# Patient Record
Sex: Female | Born: 1965 | Race: Black or African American | Hispanic: No | Marital: Single | Smoking: Never smoker
Health system: Southern US, Community
[De-identification: ages and names within clinical notes are randomized; demographics above are authoritative.]

## PROBLEM LIST (undated history)

## (undated) DIAGNOSIS — M109 Gout, unspecified: Secondary | ICD-10-CM

## (undated) DIAGNOSIS — I10 Essential (primary) hypertension: Secondary | ICD-10-CM

## (undated) DIAGNOSIS — E785 Hyperlipidemia, unspecified: Secondary | ICD-10-CM

## (undated) HISTORY — DX: Gout, unspecified: M10.9

## (undated) HISTORY — DX: Hyperlipidemia, unspecified: E78.5

---

## 2000-05-16 ENCOUNTER — Emergency Department (HOSPITAL_COMMUNITY): Admission: EM | Admit: 2000-05-16 | Discharge: 2000-05-16 | Payer: Self-pay | Admitting: Emergency Medicine

## 2004-06-09 ENCOUNTER — Inpatient Hospital Stay (HOSPITAL_COMMUNITY): Admission: AD | Admit: 2004-06-09 | Discharge: 2004-06-09 | Payer: Self-pay | Admitting: Family Medicine

## 2006-12-12 ENCOUNTER — Inpatient Hospital Stay (HOSPITAL_COMMUNITY): Admission: AD | Admit: 2006-12-12 | Discharge: 2006-12-13 | Payer: Self-pay | Admitting: Gynecology

## 2006-12-20 ENCOUNTER — Ambulatory Visit: Payer: Self-pay | Admitting: Family Medicine

## 2006-12-20 ENCOUNTER — Observation Stay (HOSPITAL_COMMUNITY): Admission: AD | Admit: 2006-12-20 | Discharge: 2006-12-21 | Payer: Self-pay | Admitting: Obstetrics and Gynecology

## 2007-12-10 ENCOUNTER — Emergency Department (HOSPITAL_COMMUNITY): Admission: EM | Admit: 2007-12-10 | Discharge: 2007-12-10 | Payer: Self-pay | Admitting: Family Medicine

## 2009-05-18 ENCOUNTER — Inpatient Hospital Stay (HOSPITAL_COMMUNITY): Admission: EM | Admit: 2009-05-18 | Discharge: 2009-05-20 | Payer: Self-pay | Admitting: Emergency Medicine

## 2010-07-26 ENCOUNTER — Emergency Department (HOSPITAL_COMMUNITY)
Admission: EM | Admit: 2010-07-26 | Discharge: 2010-07-26 | Payer: Self-pay | Source: Home / Self Care | Admitting: Family Medicine

## 2011-01-29 LAB — URINE DRUGS OF ABUSE SCREEN W ALC, ROUTINE (REF LAB)
Barbiturate Quant, Ur: NEGATIVE
Benzodiazepines.: NEGATIVE
Cocaine Metabolites: POSITIVE — AB
Marijuana Metabolite: POSITIVE — AB
Phencyclidine (PCP): NEGATIVE
Propoxyphene: NEGATIVE

## 2011-01-29 LAB — LIPID PANEL
LDL Cholesterol: 98 mg/dL (ref 0–99)
VLDL: 25 mg/dL (ref 0–40)

## 2011-01-29 LAB — TROPONIN I
Troponin I: 0.01 ng/mL (ref 0.00–0.06)
Troponin I: 0.01 ng/mL (ref 0.00–0.06)
Troponin I: 0.02 ng/mL (ref 0.00–0.06)

## 2011-01-29 LAB — CBC
HCT: 41 % (ref 36.0–46.0)
MCV: 95.1 fL (ref 78.0–100.0)
Platelets: 279 10*3/uL (ref 150–400)
RBC: 4.06 MIL/uL (ref 3.87–5.11)
RDW: 13.5 % (ref 11.5–15.5)
WBC: 6.4 10*3/uL (ref 4.0–10.5)
WBC: 6.4 10*3/uL (ref 4.0–10.5)

## 2011-01-29 LAB — DIFFERENTIAL
Eosinophils Relative: 1 % (ref 0–5)
Lymphocytes Relative: 27 % (ref 12–46)
Lymphs Abs: 1.7 10*3/uL (ref 0.7–4.0)
Neutro Abs: 4.2 10*3/uL (ref 1.7–7.7)

## 2011-01-29 LAB — BASIC METABOLIC PANEL
BUN: 8 mg/dL (ref 6–23)
BUN: 8 mg/dL (ref 6–23)
CO2: 25 mEq/L (ref 19–32)
Chloride: 109 mEq/L (ref 96–112)
Creatinine, Ser: 1.1 mg/dL (ref 0.4–1.2)
GFR calc Af Amer: >60 mL/min (ref >60)
GFR calc Af Amer: >60 mL/min (ref >60)
GFR calc non Af Amer: 57 mL/min — ABNORMAL LOW (ref >60)
Glucose, Bld: 95 mg/dL (ref 70–99)
Potassium: 3.8 mEq/L (ref 3.5–5.1)
Potassium: 3.9 mEq/L (ref 3.5–5.1)
Sodium: 137 mEq/L (ref 135–145)

## 2011-01-29 LAB — CK TOTAL AND CKMB (NOT AT ARMC)
CK, MB: 1.6 ng/mL (ref 0.3–4.0)
Relative Index: INVALID (ref 0.0–2.5)
Relative Index: INVALID (ref 0.0–2.5)
Total CK: 119 U/L (ref 7–177)
Total CK: 86 U/L (ref 7–177)
Total CK: 93 U/L (ref 7–177)

## 2011-01-29 LAB — THC (MARIJUANA), URINE, CONFIRMATION: Marijuana, Ur-Confirmation: 52 ng/mL

## 2011-01-29 LAB — TSH: TSH: 1.639 u[IU]/mL (ref 0.350–4.500)

## 2011-03-07 NOTE — H&P (Signed)
NAMESADHANA, Tammy Barajas NO.:  [id number]   MEDICAL RECORD NO.:  [id number]          PATIENT TYPE:  INP   LOCATION:  0107                         FACILITY:  [facility]   PHYSICIAN:  Della Goo, M.D. DATE OF BIRTH:  1966/10/22   DATE OF ADMISSION:  05/18/2009  DATE OF DISCHARGE:                              HISTORY & PHYSICAL   DATE OF ADMISSION:  May 18, 2009.   PRIMARY CARE PHYSICIAN:  Della Goo, M.D.   CHIEF COMPLAINT:  Elevated blood pressure.   HISTORY OF PRESENT ILLNESS:  This is a 45 year old female who was being  evaluated at [facility], who was sent to the emergency  department secondary to a severely elevated blood pressure.  The  patient's blood pressure at that time was reported as being 190/117.  The patient denies having any headache or any nausea, vomiting or  lightheadedness.  The patient states that she ran out of her medications  in April and on occasion, she has been taking her sister's medications.  The patient was seen and evaluated in the ER by the emergency department  physician and administered IV labetalol therapy with transient results.  The patient's blood pressure remained elevated, so the patient was  referred for admission.   PAST MEDICAL HISTORY:  Significant for hypertension, anemia,  hemorrhoids, and polysubstance abuse.   PAST SURGICAL HISTORY:  None.   MEDICATIONS:  The patient previously had been on hydrochlorothiazide 25  mg 1 p.o. daily, potassium chloride 20 mEq 1 p.o. daily and multivitamin  once daily.   ALLERGIES:  NO KNOWN DRUG ALLERGIES.   SOCIAL HISTORY:  The patient denies tobacco usage.  She reports  occasional alcohol usage, and she also does report occasional marijuana  usage.   FAMILY HISTORY:  Positive for hypertension and diabetes in her mother.  No history of coronary artery disease or cancer in her family that she  knows of.   REVIEW OF SYSTEMS:  Pertinents mentioned above.  All  other organ systems  are negative.   PHYSICAL EXAMINATION FINDINGS:  This is a 45 year old well-nourished,  well-developed female in no visible discomfort or acute distress.  Vital signs:  Temperature 97.5, blood pressure initially 210/119.  Heart  rate 68, respirations 18, O2 sats 99 to 100.  HEENT: Examination normocephalic, atraumatic.  Pupils equally round  reactive to light.  Extraocular movements are intact.  Funduscopic  benign.  There is no scleral icterus.  Nares are patent bilaterally.  Oropharynx is clear.  NECK:  Supple.  Full range of motion.  No thyromegaly, adenopathy, or  jugular venous distention.  CARDIOVASCULAR:  Regular rate and rhythm.  No murmurs, gallops or rubs.  LUNGS: Clear to auscultation bilaterally.  ABDOMEN: Positive bowel sounds, soft, nontender, nondistended.  No  hepatosplenomegaly.  EXTREMITIES: Without cyanosis, clubbing or edema.  NEUROLOGIC:  The patient is alert and oriented x3.  There are no focal  deficits on examination.   LABORATORY STUDIES:  White blood cell count 6.4, hemoglobin 13.7,  hematocrit 41.0, platelets 310, neutrophils 65% lymphocytes 27%.  Sodium  137, potassium 3.8, chloride 105.  Carbon  dioxide 25, BUN 8, creatinine  1.07, and glucose 95.   EKG reveals a normal sinus rhythm without acute ST-segment changes.   ASSESSMENT:  45 year old female being admitted with:  1. Hypertensive urgency  2. Medical noncompliance.  3. History of polysubstance abuse.   PLAN:  The patient will be admitted to the ICU area and placed on a  Cardene drip to help improve her blood pressure.  Norvasc therapy will  be started in the a.m. if her blood pressure tolerates.  A urine drug  screen has been ordered to evaluate for cocaine abuse, which may have  attributed to the patient's accelerated hypertension.  The patient will  be placed on DVT prophylaxis with SCDs for now.  IV Protonix therapy has  also been ordered.  If the patient's drug screen  returns positive for  cocaine, clonidine p.r.n. will also be ordered for possible withdrawal  symptoms.      Della Goo, M.D.  Electronically Signed     HJ/MEDQ  D:  05/18/2009  T:  05/18/2009  Job:  [id number]

## 2011-03-07 NOTE — Discharge Summary (Signed)
NAMEGLEN, Tammy Barajas NO.:  [id number]   MEDICAL RECORD NO.:  [id number]          PATIENT TYPE:  INP   LOCATION:  1428                         FACILITY:  [facility]   PHYSICIAN:  Hillery Aldo, M.D.   DATE OF BIRTH:  Mar 14, 1966   DATE OF ADMISSION:  05/18/2009  DATE OF DISCHARGE:  05/20/2009                               DISCHARGE SUMMARY   PRIMARY CARE PHYSICIAN:  Della Goo, M.D.   DISCHARGE DIAGNOSES:  1. Accelerated hypertension in the setting of cocaine abuse.  2. History of anemia.  3. Polysubstance abuse.   DISCHARGE MEDICATIONS:  1. Hydrochlorothiazide 25 mg p.o. daily.  2. Norvasc 5 mg p.o. daily.   CONSULTATIONS:  Social work.   BRIEF ADMISSION HISTORY OF PRESENT ILLNESS:  The patient is a 45-year-  old female who presented to the hospital with a chief complaint of  severely elevated blood pressure.  The patient presented to the [facility] and had routine vital signs done and was found to have a  blood pressure of 190/117 and subsequently was referred to the emergency  department for treatment of this.  The patient had been on  hydrochlorothiazide as an outpatient but states that she ran out of her  medications back in April and has not taken her blood pressure medicines  since that time.  For full details, please see the dictated report done by Dr. Lovell Sheehan.   PROCEDURES AND DIAGNOSTIC STUDIES:  None.   DISCHARGE LABORATORY VALUES:  Cardiac markers were negative x3 sets.  Lipid profile showed a cholesterol of 192, triglycerides 125, HDL 69,  LDL 98.  TSH was normal at 1.639.  Urine drug screen was positive for  marijuana and cocaine metabolites.   HOSPITAL COURSE BY PROBLEM:  1. Accelerated hypertension in the setting of cocaine abuse:  The      patient was admitted and labetalol failed to relieve her      hypertensive urgency.  She subsequently was admitted to the ICU and      put on a Cardene drip.  This was titrated off as  her blood pressure      came down.  She was resumed on hydrochlorothiazide and low-dose      Norvasc.  Her blood pressure was somewhat elevated, and the Norvasc      was titrated up prior to discharge.  The patient is encouraged to      comply with her medications as prescribed and to follow up with Dr.      Lovell Sheehan.  2. History of anemia:  The patient is not currently anemic.  3. Polysubstance abuse:  The patient was counseled regarding the risk      of heart attacks and stroke with accelerated hypertension in the      setting of cocaine abuse.  Social work was contacted for an ADS      referral.   DISPOSITION:  The patient is medically stable and will be discharged  home.  She is encouraged to follow up with Dr. Lovell Sheehan for ongoing  evaluation and treatment of her blood pressure issues.  She is  encouraged to avoid cocaine use.   Time spent coordinating care for discharge and discharge instructions  equals 25 minutes.      Hillery Aldo, M.D.  Electronically Signed     CR/MEDQ  D:  05/20/2009  T:  05/20/2009  Job:  [id number]   cc:   Della Goo, M.D.  Fax: [phone]

## 2013-07-23 ENCOUNTER — Encounter: Payer: Self-pay | Admitting: *Deleted

## 2013-09-03 ENCOUNTER — Encounter: Payer: Self-pay | Admitting: Obstetrics & Gynecology

## 2013-09-30 ENCOUNTER — Other Ambulatory Visit (HOSPITAL_COMMUNITY): Payer: Self-pay | Admitting: Internal Medicine

## 2013-09-30 DIAGNOSIS — Z1231 Encounter for screening mammogram for malignant neoplasm of breast: Secondary | ICD-10-CM

## 2013-10-21 ENCOUNTER — Ambulatory Visit (HOSPITAL_COMMUNITY): Payer: Self-pay

## 2013-11-10 ENCOUNTER — Ambulatory Visit (HOSPITAL_COMMUNITY)
Admission: RE | Admit: 2013-11-10 | Discharge: 2013-11-10 | Disposition: A | Discharge status: Home / Self Care | Payer: Self-pay | Source: Ambulatory Visit | Attending: Internal Medicine | Admitting: Internal Medicine

## 2013-11-10 DIAGNOSIS — Z1231 Encounter for screening mammogram for malignant neoplasm of breast: Secondary | ICD-10-CM

## 2013-11-12 ENCOUNTER — Other Ambulatory Visit: Payer: Self-pay | Admitting: Internal Medicine

## 2013-11-12 DIAGNOSIS — R928 Other abnormal and inconclusive findings on diagnostic imaging of breast: Secondary | ICD-10-CM

## 2013-11-13 ENCOUNTER — Other Ambulatory Visit: Payer: Self-pay | Admitting: Internal Medicine

## 2013-11-13 DIAGNOSIS — R928 Other abnormal and inconclusive findings on diagnostic imaging of breast: Secondary | ICD-10-CM

## 2013-12-02 ENCOUNTER — Ambulatory Visit (HOSPITAL_COMMUNITY): Payer: Self-pay

## 2013-12-17 ENCOUNTER — Other Ambulatory Visit: Payer: Self-pay

## 2013-12-22 ENCOUNTER — Other Ambulatory Visit: Payer: Self-pay

## 2014-01-06 ENCOUNTER — Encounter (INDEPENDENT_AMBULATORY_CARE_PROVIDER_SITE_OTHER): Payer: Self-pay

## 2014-01-06 ENCOUNTER — Encounter (HOSPITAL_COMMUNITY): Payer: Self-pay

## 2014-01-06 ENCOUNTER — Ambulatory Visit (HOSPITAL_COMMUNITY)
Admission: RE | Admit: 2014-01-06 | Discharge: 2014-01-06 | Disposition: A | Discharge status: Home / Self Care | Payer: Self-pay | Source: Ambulatory Visit | Attending: Obstetrics and Gynecology | Admitting: Obstetrics and Gynecology

## 2014-01-06 VITALS — BP 162/98 | Temp 98.9°F | Ht 66.5 in | Wt 230.6 lb

## 2014-01-06 DIAGNOSIS — Z1239 Encounter for other screening for malignant neoplasm of breast: Secondary | ICD-10-CM

## 2014-01-06 HISTORY — DX: Essential (primary) hypertension: I10

## 2014-01-06 NOTE — Patient Instructions (Signed)
Taught Tammy Barajas how to perform BSE and gave educational materials to take home. Patient did not need a Pap smear today due to last Pap smear was in November 2014 per patient. Let her know BCCCP will cover Pap smears every 3 years unless has a history of abnormal Pap smears. Referred patient to the [facility] for bilateral diagnostic mammogram and possible ultrasound per recommendation. Appointment scheduled for Thursday, January 15, 2014 at 1300. Patient aware of appointment and will be there. Tammy Barajas verbalized understanding.  Derrica Sieg, Arvil Chaco, RN 4:15 PM

## 2014-01-06 NOTE — Progress Notes (Signed)
Patient referred to [facility] by the [facility] due to recommending additional imaging of bilateral breasts. Screening mammogram completed 11/10/2013 at [facility].  Pap Smear:  Pap smear not completed today. Last Pap smear was November 2014 at the [facility] and normal per patient. Per patient no history of an abnormal Pap smear. No Pap smear results in EPIC.  Physical exam: Breasts Breasts symmetrical. No skin abnormalities bilateral breasts. No nipple retraction bilateral breasts. No nipple discharge bilateral breasts. No lymphadenopathy. No lumps palpated bilateral breasts. No complaints of pain or tenderness on exam. Referred patient to the [facility] for bilateral diagnostic mammogram and possible ultrasound per recommendation. Appointment scheduled for Thursday, January 15, 2014 at 1300    Pelvic/Bimanual No Pap smear completed today since last Pap smear was November 2014 per patient. Pap smear not indicated per BCCCP guidelines.

## 2014-01-15 ENCOUNTER — Ambulatory Visit
Admission: RE | Admit: 2014-01-15 | Discharge: 2014-01-15 | Disposition: A | Discharge status: Home / Self Care | Payer: No Typology Code available for payment source | Source: Ambulatory Visit | Attending: Internal Medicine | Admitting: Internal Medicine

## 2014-01-15 DIAGNOSIS — R928 Other abnormal and inconclusive findings on diagnostic imaging of breast: Secondary | ICD-10-CM

## 2014-01-16 ENCOUNTER — Ambulatory Visit: Payer: Self-pay

## 2014-01-16 ENCOUNTER — Other Ambulatory Visit: Payer: Self-pay

## 2014-05-14 ENCOUNTER — Encounter (HOSPITAL_COMMUNITY): Payer: Self-pay | Admitting: Emergency Medicine

## 2014-05-14 ENCOUNTER — Emergency Department (INDEPENDENT_AMBULATORY_CARE_PROVIDER_SITE_OTHER)
Admission: EM | Admit: 2014-05-14 | Discharge: 2014-05-14 | Disposition: A | Discharge status: Home / Self Care | Payer: Self-pay | Source: Home / Self Care

## 2014-05-14 DIAGNOSIS — I10 Essential (primary) hypertension: Secondary | ICD-10-CM

## 2014-05-14 MED ORDER — AMLODIPINE BESYLATE 5 MG PO TABS: ORAL_TABLET | ORAL | Status: DC

## 2014-05-14 MED ORDER — HYDROCHLOROTHIAZIDE 25 MG PO TABS: 25.0000 mg | ORAL_TABLET | Freq: Every day | ORAL | Status: DC

## 2014-05-14 NOTE — Discharge Instructions (Signed)
Hypertension °Hypertension, commonly called high blood pressure, is when the force of blood pumping through your arteries is too strong. Your arteries are the blood vessels that carry blood from your heart throughout your body. A blood pressure reading consists of a higher number over a lower number, such as 110/72. The higher number (systolic) is the pressure inside your arteries when your heart pumps. The lower number (diastolic) is the pressure inside your arteries when your heart relaxes. Ideally you want your blood pressure below 120/80. °Hypertension forces your heart to work harder to pump blood. Your arteries may become narrow or stiff. Having hypertension puts you at risk for heart disease, stroke, and other problems.  °RISK FACTORS °Some risk factors for high blood pressure are controllable. Others are not.  °Risk factors you cannot control include:  °· Race. You may be at higher risk if you are African American. °· Age. Risk increases with age. °· Gender. Men are at higher risk than women before age 45 years. After age 65, women are at higher risk than men. °Risk factors you can control include: °· Not getting enough exercise or physical activity. °· Being overweight. °· Getting too much fat, sugar, calories, or salt in your diet. °· Drinking too much alcohol. °SIGNS AND SYMPTOMS °Hypertension does not usually cause signs or symptoms. Extremely high blood pressure (hypertensive crisis) may cause headache, anxiety, shortness of breath, and nosebleed. °DIAGNOSIS  °To check if you have hypertension, your health care provider will measure your blood pressure while you are seated, with your arm held at the level of your heart. It should be measured at least twice using the same arm. Certain conditions can cause a difference in blood pressure between your right and left arms. A blood pressure reading that is higher than normal on one occasion does not mean that you need treatment. If one blood pressure reading  is high, ask your health care provider about having it checked again. °TREATMENT  °Treating high blood pressure includes making lifestyle changes and possibly taking medicine. Living a healthy lifestyle can help lower high blood pressure. You may need to change some of your habits. °Lifestyle changes may include: °· Following the DASH diet. This diet is high in fruits, vegetables, and whole grains. It is low in salt, red meat, and added sugars. °· Getting at least 2½ hours of brisk physical activity every week. °· Losing weight if necessary. °· Not smoking. °· Limiting alcoholic beverages. °· Learning ways to reduce stress. ° If lifestyle changes are not enough to get your blood pressure under control, your health care provider may prescribe medicine. You may need to take more than one. Work closely with your health care provider to understand the risks and benefits. °HOME CARE INSTRUCTIONS °· Have your blood pressure rechecked as directed by your health care provider.   °· Take medicines only as directed by your health care provider. Follow the directions carefully. Blood pressure medicines must be taken as prescribed. The medicine does not work as well when you skip doses. Skipping doses also puts you at risk for problems.   °· Do not smoke.   °· Monitor your blood pressure at home as directed by your health care provider.  °SEEK MEDICAL CARE IF:  °· You think you are having a reaction to medicines taken. °· You have recurrent headaches or feel dizzy. °· You have swelling in your ankles. °· You have trouble with your vision. °SEEK IMMEDIATE MEDICAL CARE IF: °· You develop a severe headache or confusion. °·   You have unusual weakness, numbness, or feel faint. °· You have severe chest or abdominal pain. °· You vomit repeatedly. °· You have trouble breathing. °MAKE SURE YOU:  °· Understand these instructions. °· Will watch your condition. °· Will get help right away if you are not doing well or get worse. °Document  Released: 10/09/2005 Document Revised: 02/23/2014 Document Reviewed: 08/01/2013 °ExitCare® Patient Information ©2015 [employer]. This information is not intended to replace advice given to you by your health care provider. Make sure you discuss any questions you have with your health care provider. ° °Managing Your High Blood Pressure °Blood pressure is a measurement of how forceful your blood is pressing against the walls of the arteries. Arteries are muscular tubes within the circulatory system. Blood pressure does not stay the same. Blood pressure rises when you are active, excited, or nervous; and it lowers during sleep and relaxation. If the numbers measuring your blood pressure stay above normal most of the time, you are at risk for health problems. High blood pressure (hypertension) is a long-term (chronic) condition in which blood pressure is elevated. °A blood pressure reading is recorded as two numbers, such as 120 over 80 (or 120/80). The first, higher number is called the systolic pressure. It is a measure of the pressure in your arteries as the heart beats. The second, lower number is called the diastolic pressure. It is a measure of the pressure in your arteries as the heart relaxes between beats.  °Keeping your blood pressure in a normal range is important to your overall health and prevention of health problems, such as heart disease and stroke. When your blood pressure is uncontrolled, your heart has to work harder than normal. High blood pressure is a very common condition in adults because blood pressure tends to rise with age. Men and women are equally likely to have hypertension but at different times in life. Before age 45, men are more likely to have hypertension. After 46 years of age, women are more likely to have it. Hypertension is especially common in African Americans. This condition often has no signs or symptoms. The cause of the condition is usually not known. Your caregiver can  help you come up with a plan to keep your blood pressure in a normal, healthy range. °BLOOD PRESSURE STAGES °Blood pressure is classified into four stages: normal, prehypertension, stage 1, and stage 2. Your blood pressure reading will be used to determine what type of treatment, if any, is necessary. Appropriate treatment options are tied to these four stages:  °Normal °· Systolic pressure (mm Hg): below 120. °· Diastolic pressure (mm Hg): below 80. °Prehypertension °· Systolic pressure (mm Hg): 120 to 139. °· Diastolic pressure (mm Hg): 80 to 89. °Stage 1 °· Systolic pressure (mm Hg): 140 to 159. °· Diastolic pressure (mm Hg): 90 to 99. °Stage 2 °· Systolic pressure (mm Hg): 160 or above. °· Diastolic pressure (mm Hg): 100 or above. °RISKS RELATED TO HIGH BLOOD PRESSURE °Managing your blood pressure is an important responsibility. Uncontrolled high blood pressure can lead to: °· A heart attack. °· A stroke. °· A weakened blood vessel (aneurysm). °· Heart failure. °· Kidney damage. °· Eye damage. °· Metabolic syndrome. °· Memory and concentration problems. °HOW TO MANAGE YOUR BLOOD PRESSURE °Blood pressure can be managed effectively with lifestyle changes and medicines (if needed). Your caregiver will help you come up with a plan to bring your blood pressure within a normal range. Your plan should include the following: °Education °·   Read all information provided by your caregivers about how to control blood pressure. °· Educate yourself on the latest guidelines and treatment recommendations. New research is always being done to further define the risks and treatments for high blood pressure. °Lifestyle changes °· Control your weight. °· Avoid smoking. °· Stay physically active. °· Reduce the amount of salt in your diet. °· Reduce stress. °· Control any chronic conditions, such as high cholesterol or diabetes. °· Reduce your alcohol intake. °Medicines °· Several medicines (antihypertensive medicines) are available,  if needed, to bring blood pressure within a normal range. °Communication °· Review all the medicines you take with your caregiver because there may be side effects or interactions. °· Talk with your caregiver about your diet, exercise habits, and other lifestyle factors that may be contributing to high blood pressure. °· See your caregiver regularly. Your caregiver can help you create and adjust your plan for managing high blood pressure. °RECOMMENDATIONS FOR TREATMENT AND FOLLOW-UP  °The following recommendations are based on current guidelines for managing high blood pressure in nonpregnant adults. Use these recommendations to identify the proper follow-up period or treatment option based on your blood pressure reading. You can discuss these options with your caregiver. °· Systolic pressure of 120 to 139 or diastolic pressure of 80 to 89: Follow up with your caregiver as directed. °· Systolic pressure of 140 to 160 or diastolic pressure of 90 to 100: Follow up with your caregiver within 2 months. °· Systolic pressure above 160 or diastolic pressure above 100: Follow up with your caregiver within 1 month. °· Systolic pressure above 180 or diastolic pressure above 110: Consider antihypertensive therapy; follow up with your caregiver within 1 week. °· Systolic pressure above 200 or diastolic pressure above 120: Begin antihypertensive therapy; follow up with your caregiver within 1 week. °Document Released: 07/03/2012 Document Reviewed: 07/03/2012 °ExitCare® Patient Information ©2015 [employer]. This information is not intended to replace advice given to you by your health care provider. Make sure you discuss any questions you have with your health care provider. ° °

## 2014-05-14 NOTE — ED Provider Notes (Signed)
CSN: [id number]     Arrival date & time 05/14/14  1515 History   First MD Initiated Contact with Patient 05/14/14 1536     Chief Complaint  Patient presents with  . Medication Refill   (Consider location/radiation/quality/duration/timing/severity/associated sxs/prior Treatment) HPI Comments: 48 year old obese female presents with a request to have her blood pressure medicines refilled. She states that her PCP cholesterol post suddenly and left town  2-3 months ago. She has no further complaints.   Past Medical History  Diagnosis Date  . Hypertension    History reviewed. No pertinent past surgical history. Family History  Problem Relation Age of Onset  . Hypertension Mother   . Hypertension Brother    History  Substance Use Topics  . Smoking status: Never Smoker   . Smokeless tobacco: Never Used  . Alcohol Use: Yes     Comment: occassionally   OB History   Grav Para Term Preterm Abortions TAB SAB Ect Mult Living   3    3  3    0     Review of Systems  Constitutional: Negative.   HENT: Negative.   Respiratory: Negative.  Negative for shortness of breath.   Cardiovascular: Negative for chest pain, palpitations and leg swelling.  Gastrointestinal: Negative.   Musculoskeletal: Negative for joint swelling.  Skin: Negative for pallor and rash.  Neurological: Negative.     Allergies  Review of patient's allergies indicates no known allergies.  Home Medications   Prior to Admission medications   Medication Sig Start Date End Date Taking? Authorizing Provider  amLODipine (NORVASC) 5 MG tablet One tablet q d for BP 05/14/14   Janne Napoleon, NP  hydrochlorothiazide (HYDRODIURIL) 25 MG tablet Take 1 tablet (25 mg total) by mouth daily. 05/14/14   Janne Napoleon, NP  Multiple Vitamin (MULTIVITAMIN) tablet Take 1 tablet by mouth daily.    Historical Provider, MD   BP 186/114  Pulse 93  Temp(Src) 97.8 F (36.6 C) (Oral)  Resp 16  SpO2 100%  LMP 04/25/2014 Physical Exam  Nursing  note and vitals reviewed. Constitutional: She appears well-developed and well-nourished. No distress.  Eyes: Conjunctivae and EOM are normal.  Neck: Normal range of motion. Neck supple.  Cardiovascular: Normal rate, regular rhythm, normal heart sounds and intact distal pulses.   Pulmonary/Chest: Effort normal and breath sounds normal. No respiratory distress. She has no wheezes. She has no rales.  Musculoskeletal: She exhibits no edema.  Neurological: She is alert. She exhibits normal muscle tone.  Skin: Skin is warm and dry.  Psychiatric: She has a normal mood and affect.    ED Course  Procedures (including critical care time) Labs Review Labs Reviewed - No data to display  Imaging Review No results found.   MDM   1. Essential hypertension    RF HCTZ 25 q d amlodipine5 mg q d F/U with doctor scheduled for next month    Janne Napoleon, NP 05/14/14 1556

## 2014-05-14 NOTE — ED Provider Notes (Signed)
Medical screening examination/treatment/procedure(s) were performed by a resident physician or non-physician practitioner and as the supervising physician I was immediately available for consultation/collaboration.  Linna Darner, MD Family Medicine   Waldemar Dickens, MD 05/14/14 [phone]

## 2014-05-14 NOTE — ED Notes (Signed)
Pt     Ran  Out      Of   Her  bp          bp  meds  About  10  Days              As  Her  Doctor      Has  Left      -  She  Says  She  Has     An  appt  19  Aug         With         [facility]

## 2014-06-10 ENCOUNTER — Encounter (HOSPITAL_COMMUNITY): Payer: Self-pay | Admitting: Emergency Medicine

## 2014-06-10 ENCOUNTER — Emergency Department (INDEPENDENT_AMBULATORY_CARE_PROVIDER_SITE_OTHER)
Admission: EM | Admit: 2014-06-10 | Discharge: 2014-06-10 | Disposition: A | Discharge status: Home / Self Care | Payer: Self-pay | Source: Home / Self Care

## 2014-06-10 ENCOUNTER — Ambulatory Visit: Payer: Self-pay | Attending: Internal Medicine

## 2014-06-10 DIAGNOSIS — I10 Essential (primary) hypertension: Secondary | ICD-10-CM

## 2014-06-10 MED ORDER — HYDROCHLOROTHIAZIDE 25 MG PO TABS: 25.0000 mg | ORAL_TABLET | Freq: Every day | ORAL | Status: DC

## 2014-06-10 MED ORDER — AMLODIPINE BESYLATE 5 MG PO TABS: 5.0000 mg | ORAL_TABLET | Freq: Every day | ORAL | Status: DC

## 2014-06-10 NOTE — ED Provider Notes (Signed)
Medical screening examination/treatment/procedure(s) were performed by non-physician practitioner and as supervising physician I was immediately available for consultation/collaboration.  Philipp Deputy, M.D.  Harden Mo, MD 06/10/14 2230

## 2014-06-10 NOTE — ED Notes (Signed)
Pt  Is  Out  Of  Her norvasc     5  Mg  And  hctz   25  Mg      She  Was  Told  By the  [facility] to come  Here  For  Refills      She  Verbalizes  No  Complaints

## 2014-06-10 NOTE — ED Provider Notes (Signed)
CSN: [id number]     Arrival date & time 06/10/14  1229 History   First MD Initiated Contact with Patient 06/10/14 1258     Chief Complaint  Patient presents with  . Medication Refill   (Consider location/radiation/quality/duration/timing/severity/associated sxs/prior Treatment) HPI Comments: 48 year old female with a history of hypertension returns again this month for refill on her blood pressure medicines. The last visit she told me she was scheduled to have an appointment with a physician this month to get her blood pressure meds rewritten. It turns out that she was getting financial help this month and does not have an appointment with a physician. She states her blood pressure is elevated because she went out and got drunk last night in a some salty fish and has not taken her blood pressure medication this morning.   Past Medical History  Diagnosis Date  . Hypertension    History reviewed. No pertinent past surgical history. Family History  Problem Relation Age of Onset  . Hypertension Mother   . Hypertension Brother    History  Substance Use Topics  . Smoking status: Never Smoker   . Smokeless tobacco: Never Used  . Alcohol Use: Yes     Comment: occassionally   OB History   Grav Para Term Preterm Abortions TAB SAB Ect Mult Living   3    3  3    0     Review of Systems  Constitutional: Negative.   Respiratory: Negative.   Cardiovascular: Negative.  Negative for chest pain and palpitations.  Gastrointestinal: Negative.   Neurological: Negative.     Allergies  Review of patient's allergies indicates no known allergies.  Home Medications   Prior to Admission medications   Medication Sig Start Date End Date Taking? Authorizing Provider  amLODipine (NORVASC) 5 MG tablet One tablet q d for BP 05/14/14   Janne Napoleon, NP  amLODipine (NORVASC) 5 MG tablet Take 1 tablet (5 mg total) by mouth daily. 06/10/14   Janne Napoleon, NP  hydrochlorothiazide (HYDRODIURIL) 25 MG tablet Take  1 tablet (25 mg total) by mouth daily. 05/14/14   Janne Napoleon, NP  hydrochlorothiazide (HYDRODIURIL) 25 MG tablet Take 1 tablet (25 mg total) by mouth daily. 06/10/14   Janne Napoleon, NP  Multiple Vitamin (MULTIVITAMIN) tablet Take 1 tablet by mouth daily.    Historical Provider, MD   BP 177/102  Pulse 72  Temp(Src) 98.6 F (37 C) (Oral)  Resp 18  SpO2 100%  LMP 04/25/2014 Physical Exam  Nursing note and vitals reviewed. Constitutional: She is oriented to person, place, and time. She appears well-developed and well-nourished. No distress.  Eyes: Conjunctivae and EOM are normal.  Neck: Normal range of motion. Neck supple.  Cardiovascular: Normal rate, regular rhythm and normal heart sounds.   Pulmonary/Chest: Effort normal and breath sounds normal. No respiratory distress.  Musculoskeletal: She exhibits no edema.  Neurological: She is alert and oriented to person, place, and time.  Skin: Skin is warm and dry.  Psychiatric: She has a normal mood and affect.    ED Course  Procedures (including critical care time) Labs Review Labs Reviewed - No data to display  Imaging Review No results found.   MDM   1. Essential hypertension     Refill amlodipine and HCTZ. Must see PCP asap.    Janne Napoleon, NP 06/10/14 1357

## 2014-06-10 NOTE — Discharge Instructions (Signed)

## 2014-07-03 ENCOUNTER — Encounter: Payer: Self-pay | Admitting: Internal Medicine

## 2014-07-03 ENCOUNTER — Ambulatory Visit: Payer: Self-pay | Attending: Internal Medicine | Admitting: Internal Medicine

## 2014-07-03 VITALS — BP 138/95 | HR 86 | Temp 98.0°F | Resp 16 | Ht 66.5 in | Wt 226.0 lb

## 2014-07-03 DIAGNOSIS — I10 Essential (primary) hypertension: Secondary | ICD-10-CM | POA: Insufficient documentation

## 2014-07-03 DIAGNOSIS — Z2821 Immunization not carried out because of patient refusal: Secondary | ICD-10-CM

## 2014-07-03 LAB — CBC
HCT: 37.2 % (ref 36.0–46.0)
HEMOGLOBIN: 12.2 g/dL (ref 12.0–15.0)
MCH: 29.3 pg (ref 26.0–34.0)
MCHC: 32.8 g/dL (ref 30.0–36.0)
MCV: 89.4 fL (ref 78.0–100.0)
PLATELETS: 340 10*3/uL (ref 150–400)
RBC: 4.16 MIL/uL (ref 3.87–5.11)
RDW: 14.4 % (ref 11.5–15.5)
WBC: 6.4 10*3/uL (ref 4.0–10.5)

## 2014-07-03 MED ORDER — HYDROCHLOROTHIAZIDE 25 MG PO TABS: 25.0000 mg | ORAL_TABLET | Freq: Every day | ORAL | Status: DC

## 2014-07-03 MED ORDER — AMLODIPINE BESYLATE 5 MG PO TABS: 5.0000 mg | ORAL_TABLET | Freq: Every day | ORAL | Status: DC

## 2014-07-03 NOTE — Progress Notes (Signed)
Patient ID: [name], female   DOB: 08/19/1966, 48 y.o.   MRN: [id number]     [id number]  [id number]  DOB - 03-04-1966  CC:  Chief Complaint  Patient presents with  . Establish Care       HPI: Tammy Barajas is a 48 y.o. female here today to establish medical care.  Patient presents to clinic today to establish care.  She reports that she had a PCP in [facility] but the practice closed.  Patient reports that she went to urgent care to get a refill until she found a PCP.  She is fairly healthy and has no other medical history other than hypertension.  She denies tobacco use but endorses marijuana use. She is currently up to date on pap and mammography.    Patient has No headache, No chest pain, No abdominal pain - No Nausea, No new weakness tingling or numbness, No Cough - SOB.  No Known Allergies Past Medical History  Diagnosis Date  . Hypertension    Current Outpatient Prescriptions on File Prior to Visit  Medication Sig Dispense Refill  . amLODipine (NORVASC) 5 MG tablet One tablet q d for BP  30 tablet  0  . amLODipine (NORVASC) 5 MG tablet Take 1 tablet (5 mg total) by mouth daily.  30 tablet  0  . hydrochlorothiazide (HYDRODIURIL) 25 MG tablet Take 1 tablet (25 mg total) by mouth daily.  30 tablet  0  . hydrochlorothiazide (HYDRODIURIL) 25 MG tablet Take 1 tablet (25 mg total) by mouth daily.  30 tablet  0  . Multiple Vitamin (MULTIVITAMIN) tablet Take 1 tablet by mouth daily.       No current facility-administered medications on file prior to visit.   Family History  Problem Relation Age of Onset  . Hypertension Mother   . Hypertension Brother    History   Social History  . Marital Status: Single    Spouse Name: N/A    Number of Children: N/A  . Years of Education: N/A   Occupational History  . Not on file.   Social History Main Topics  . Smoking status: Never Smoker   . Smokeless tobacco: Never Used  . Alcohol Use: Yes     Comment:  occassionally  . Drug Use: 3.00 per week    Special: Marijuana  . Sexual Activity: Yes    Birth Control/ Protection: None, Condom   Other Topics Concern  . Not on file   Social History Narrative  . No narrative on file    Review of Systems: Constitutional: Negative for fever, chills, diaphoresis, activity change, appetite change and fatigue. HENT: Negative for ear pain, nosebleeds, congestion, facial swelling, rhinorrhea, neck pain, neck stiffness and ear discharge.  Eyes: Negative for pain, discharge, redness, itching and visual disturbance. Respiratory: Negative for cough, choking, chest tightness, shortness of breath, wheezing and stridor.  Cardiovascular: Negative for chest pain, palpitations and leg swelling. Gastrointestinal: Negative for abdominal distention. Genitourinary: Negative for dysuria, urgency, frequency, hematuria, flank pain, decreased urine volume, difficulty urinating and dyspareunia.  Musculoskeletal: Negative for back pain, joint swelling, arthralgia and gait problem. Neurological: Negative for dizziness, tremors, seizures, syncope, facial asymmetry, speech difficulty, weakness, light-headedness, numbness and headaches.  Hematological: Negative for adenopathy. Does not bruise/bleed easily. Psychiatric/Behavioral: Negative for hallucinations, behavioral problems, confusion, dysphoric mood, decreased concentration and agitation.    Objective:   Filed Vitals:   07/03/14 1148  BP: 138/95  Pulse: 86  Temp: 98 F (36.7 C)  Resp: 16    Physical Exam: Constitutional: Patient appears well-developed and well-nourished. No distress. HENT: Normocephalic, atraumatic, External right and left ear normal. Oropharynx is clear and moist.  Eyes: Conjunctivae and EOM are normal. PERRLA, no scleral icterus. Neck: Normal ROM. Neck supple. No JVD. No tracheal deviation. No thyromegaly. CVS: RRR, S1/S2 +, no murmurs, no gallops, no carotid bruit.  Pulmonary: Effort and  breath sounds normal, no stridor, rhonchi, wheezes, rales.  Abdominal: Soft. BS +, no distension, tenderness, rebound or guarding.  Musculoskeletal: Normal range of motion. No edema and no tenderness.  Lymphadenopathy: No lymphadenopathy noted, cervical,  Neuro: Alert. Normal reflexes, muscle tone coordination. No cranial nerve deficit. Skin: Skin is warm and dry. No rash noted. Not diaphoretic. No erythema. No pallor. Psychiatric: Normal mood and affect. Behavior, judgment, thought content normal.  Lab Results  Component Value Date   WBC 6.4 05/19/2009   HGB 12.5 05/19/2009   HCT 38.8 05/19/2009   MCV 95.4 05/19/2009   PLT 279 05/19/2009   Lab Results  Component Value Date   CREATININE 1.10 05/19/2009   BUN 8 05/19/2009   NA 140 05/19/2009   K 3.9 05/19/2009   CL 109 05/19/2009   CO2 25 05/19/2009    No results found for this basename: HGBA1C   Lipid Panel     Component Value Date/Time   CHOL  Value: 192        ATP III CLASSIFICATION:  <200     mg/dL   Desirable  200-239  mg/dL   Borderline High  >=240    mg/dL   High        05/19/2009 0610   TRIG 125 05/19/2009 0610   HDL 69 05/19/2009 0610   CHOLHDL 2.8 05/19/2009 0610   VLDL 25 05/19/2009 0610   LDLCALC  Value: 98        Total Cholesterol/HDL:CHD Risk Coronary Heart Disease Risk Table                     Men   Women  1/2 Average Risk   3.4   3.3  Average Risk       5.0   4.4  2 X Average Risk   9.6   7.1  3 X Average Risk  23.4   11.0        Use the calculated Patient Ratio above and the CHD Risk Table to determine the patient's CHD Risk.        ATP III CLASSIFICATION (LDL):  <100     mg/dL   Optimal  100-129  mg/dL   Near or Above                    Optimal  130-159  mg/dL   Borderline  160-189  mg/dL   High  >190     mg/dL   Very High 05/19/2009 0610       Assessment and plan:   Tammy Barajas was seen today for establish care.  Diagnoses and associated orders for this visit:  Essential hypertension - Continue hydrochlorothiazide  (HYDRODIURIL) 25 MG tablet; Take 1 tablet (25 mg total) by mouth daily. - Continue amLODipine (NORVASC) 5 MG tablet; Take 1 tablet (5 mg total) by mouth daily. - CBC - COMPLETE METABOLIC PANEL WITH GFR - Lipid panel - Hemoglobin A1C - TSH  Influenza vaccine refused Stressed importance of immunization    Return in about 6 months (around 01/01/2015) for Hypertension.  The patient was  given clear instructions to go to ER or return to medical center if symptoms don't improve, worsen or new problems develop. The patient verbalized understanding.    Chari Manning, NP-C [facility] [phone] 07/20/2014, 2:16 PM

## 2014-07-03 NOTE — Progress Notes (Signed)
Pt is here to establish care. Pt has a history of HTN.

## 2014-07-04 LAB — COMPLETE METABOLIC PANEL WITH GFR
ALBUMIN: 4.2 g/dL (ref 3.5–5.2)
ALT: 14 U/L (ref 0–35)
AST: 17 U/L (ref 0–37)
Alkaline Phosphatase: 54 U/L (ref 39–117)
BILIRUBIN TOTAL: 0.5 mg/dL (ref 0.2–1.2)
BUN: 15 mg/dL (ref 6–23)
CO2: 32 mEq/L (ref 19–32)
Calcium: 9.5 mg/dL (ref 8.4–10.5)
Chloride: 99 mEq/L (ref 96–112)
Creat: 0.97 mg/dL (ref 0.50–1.10)
GFR, Est African American: 82 mL/min
GFR, Est Non African American: 71 mL/min
Glucose, Bld: 93 mg/dL (ref 70–99)
Potassium: 4.2 mEq/L (ref 3.5–5.3)
SODIUM: 137 meq/L (ref 135–145)
Total Protein: 7.7 g/dL (ref 6.0–8.3)

## 2014-07-04 LAB — LIPID PANEL
CHOL/HDL RATIO: 2.7 ratio
Cholesterol: 192 mg/dL (ref 0–200)
HDL: 71 mg/dL (ref >39)
LDL CALC: 98 mg/dL (ref 0–99)
Triglycerides: 113 mg/dL (ref <150)
VLDL: 23 mg/dL (ref 0–40)

## 2014-07-04 LAB — TSH: TSH: 0.756 u[IU]/mL (ref 0.350–4.500)

## 2014-07-04 LAB — HEMOGLOBIN A1C
Hgb A1c MFr Bld: 5.7 % — ABNORMAL HIGH (ref <5.7)
MEAN PLASMA GLUCOSE: 117 mg/dL — AB (ref <117)

## 2014-07-17 ENCOUNTER — Telehealth: Payer: Self-pay | Admitting: Emergency Medicine

## 2014-07-17 ENCOUNTER — Encounter: Payer: Self-pay | Admitting: Emergency Medicine

## 2014-07-17 NOTE — Telephone Encounter (Signed)
Pt given lab results 

## 2014-07-17 NOTE — Telephone Encounter (Signed)
Message copied by Ricci Barker on Fri Jul 17, 2014  5:34 PM ------      Message from: Chari Manning A      Created: Mon Jul 13, 2014 10:44 PM       Labs are normal ------

## 2014-08-24 ENCOUNTER — Encounter: Payer: Self-pay | Admitting: Internal Medicine

## 2015-04-30 ENCOUNTER — Other Ambulatory Visit: Payer: Self-pay | Admitting: Internal Medicine

## 2015-05-05 ENCOUNTER — Telehealth: Payer: Self-pay | Admitting: Internal Medicine

## 2015-05-05 NOTE — Telephone Encounter (Signed)
Patient called requesting medication refill for hydrochlorothiazide (HYDRODIURIL) 25 MG tablet and amLODipine (NORVASC) 5 MG tablet. Please f/u

## 2015-05-10 ENCOUNTER — Ambulatory Visit: Payer: Self-pay | Attending: Internal Medicine | Admitting: Internal Medicine

## 2015-05-10 ENCOUNTER — Encounter: Payer: Self-pay | Admitting: Internal Medicine

## 2015-05-10 VITALS — BP 172/108 | HR 82 | Temp 98.0°F | Resp 16 | Ht 66.0 in | Wt 228.0 lb

## 2015-05-10 DIAGNOSIS — Z79899 Other long term (current) drug therapy: Secondary | ICD-10-CM | POA: Insufficient documentation

## 2015-05-10 DIAGNOSIS — F17210 Nicotine dependence, cigarettes, uncomplicated: Secondary | ICD-10-CM | POA: Insufficient documentation

## 2015-05-10 DIAGNOSIS — R030 Elevated blood-pressure reading, without diagnosis of hypertension: Secondary | ICD-10-CM

## 2015-05-10 DIAGNOSIS — I10 Essential (primary) hypertension: Secondary | ICD-10-CM | POA: Insufficient documentation

## 2015-05-10 DIAGNOSIS — IMO0001 Reserved for inherently not codable concepts without codable children: Secondary | ICD-10-CM

## 2015-05-10 LAB — BASIC METABOLIC PANEL
BUN: 14 mg/dL (ref 6–23)
CO2: 23 meq/L (ref 19–32)
Calcium: 9.1 mg/dL (ref 8.4–10.5)
Chloride: 105 mEq/L (ref 96–112)
Creat: 1.07 mg/dL (ref 0.50–1.10)
Glucose, Bld: 92 mg/dL (ref 70–99)
POTASSIUM: 4.3 meq/L (ref 3.5–5.3)
Sodium: 139 mEq/L (ref 135–145)

## 2015-05-10 MED ORDER — LOSARTAN POTASSIUM 25 MG PO TABS: 25.0000 mg | ORAL_TABLET | Freq: Every day | ORAL | Status: DC

## 2015-05-10 MED ORDER — HYDROCHLOROTHIAZIDE 25 MG PO TABS: 25.0000 mg | ORAL_TABLET | Freq: Every day | ORAL | Status: DC

## 2015-05-10 MED ORDER — CLONIDINE HCL 0.1 MG PO TABS
0.2000 mg | ORAL_TABLET | Freq: Once | ORAL | Status: AC
  Administered 2015-05-10: 0.2 mg via ORAL

## 2015-05-10 NOTE — Progress Notes (Signed)
Patient here for follow up for HTN. Patient denies any pain today. Patient stopped taking Norvasc about 2-3 weeks ago due to it making her have headaches. Patient needs a refill on hydrochlorothiazide. Patient took her last tablet on Saturday, 05/08/15. Patient has not taken any medications today.

## 2015-05-10 NOTE — Progress Notes (Signed)
Patient ID: Tammy Barajas, female   DOB: 04/29/1966, 49 y.o.   MRN: [id number] Subjective:  Tammy Barajas is a 49 y.o. female with hypertension. Patient reports that she has not taken Norvasc in over 3 weeks and has been out of HCTZ for 1 week.  Current Outpatient Prescriptions  Medication Sig Dispense Refill  . hydrochlorothiazide (HYDRODIURIL) 25 MG tablet Take 1 tablet (25 mg total) by mouth daily. 30 tablet 0  . hydrochlorothiazide (HYDRODIURIL) 25 MG tablet Take 1 tablet (25 mg total) by mouth daily. 30 tablet 5  . Multiple Vitamin (MULTIVITAMIN) tablet Take 1 tablet by mouth daily.    Marland Kitchen amLODipine (NORVASC) 5 MG tablet One tablet q d for BP (Patient not taking: Reported on 05/10/2015) 30 tablet 0  . amLODipine (NORVASC) 5 MG tablet Take 1 tablet (5 mg total) by mouth daily. (Patient not taking: Reported on 05/10/2015) 30 tablet 5   No current facility-administered medications for this visit.    Hypertension ROS: not taking medications regularly as instructed, medication side effects include: headaches, no TIA's, no chest pain on exertion, no dyspnea on exertion and no swelling of ankles.  New concerns: Patient states that she will not take Norvasc again because of headaches. She states that if I give her any medication that is too strong she will not take it.   Objective:  BP 176/120 mmHg  Pulse 82  Temp(Src) 98 F (36.7 C) (Oral)  Resp 16  Ht 5\' 6"  (1.676 m)  Wt 228 lb (103.42 kg)  BMI 36.82 kg/m2  SpO2 97%  Appearance alert, well appearing, and in no distress, oriented to person, place, and time and overweight. General exam BP noted to be severely elevated today in office, S1, S2 normal, no gallop, no murmur, chest clear, no JVD, no HSM, no edema, CVS exam  - normal rate, regular rhythm, normal S1, S2, no murmurs, rubs, clicks or gallops, no JVD.  Lab review: orders written for new lab studies as appropriate; see orders.   Assessment:   Hypertension patient poorly compliant,  needs to quit smoking and needs to follow diet more regularly.  Patient given 0.2 mg of Clonidine in office today.   Plan:  Reviewed diet, exercise and weight control. Recommended sodium restriction. Very strongly urged to quit smoking to reduce cardiovascular risk. Cardiovascular risk and specific lipid/LDL goals reviewed.. She will come back in 2 weeks for a BP check. I have added Losartan 25 mg daily and she will also continue HCTZ.   Return in about 2 weeks (around 05/24/2015) for Nurse Visit-BP check and 3 mo PCP HTN.  Lance Bosch, NP 05/10/2015 7:05 PM

## 2015-05-10 NOTE — Patient Instructions (Signed)
Hypertension Hypertension is another name for high blood pressure. High blood pressure forces your heart to work harder to pump blood. A blood pressure reading has two numbers, which includes a higher number over a lower number (example: 110/72). HOME CARE   Have your blood pressure rechecked by your doctor.  Only take medicine as told by your doctor. Follow the directions carefully. The medicine does not work as well if you skip doses. Skipping doses also puts you at risk for problems.  Do not smoke.  Monitor your blood pressure at home as told by your doctor. GET HELP IF:  You think you are having a reaction to the medicine you are taking.  You have repeat headaches or feel dizzy.  You have puffiness (swelling) in your ankles.  You have trouble with your vision. GET HELP RIGHT AWAY IF:   You get a very bad headache and are confused.  You feel weak, numb, or faint.  You get chest or belly (abdominal) pain.  You throw up (vomit).  You cannot breathe very well. MAKE SURE YOU:   Understand these instructions.  Will watch your condition.  Will get help right away if you are not doing well or get worse. Document Released: 03/27/2008 Document Revised: 10/14/2013 Document Reviewed: 08/01/2013 ExitCare Patient Information 2015 [employer]. This information is not intended to replace advice given to you by your health care provider. Make sure you discuss any questions you have with your health care provider.  

## 2015-05-14 ENCOUNTER — Telehealth: Payer: Self-pay

## 2015-05-14 NOTE — Telephone Encounter (Signed)
-----   Message from Lance Bosch, NP sent at 05/11/2015 10:56 AM EDT ----- Lab normal

## 2015-05-14 NOTE — Telephone Encounter (Signed)
Nurse called patient, patient verified date of birth. Patient aware of normal labs. Patient voices understanding and has no further questions at this time.

## 2015-06-10 ENCOUNTER — Encounter: Payer: Self-pay | Admitting: Pharmacist

## 2016-03-27 ENCOUNTER — Other Ambulatory Visit: Payer: Self-pay | Admitting: Internal Medicine

## 2016-05-18 ENCOUNTER — Telehealth: Payer: Self-pay | Admitting: Internal Medicine

## 2016-05-18 MED ORDER — HYDROCHLOROTHIAZIDE 25 MG PO TABS: 25.0000 mg | ORAL_TABLET | Freq: Every day | ORAL | 0 refills | Status: DC

## 2016-05-18 NOTE — Telephone Encounter (Signed)
HCTZ refilled x 30 days

## 2016-05-18 NOTE — Telephone Encounter (Signed)
Medication Refill: hydrochlorothiazide (HYDRODIURIL) 25 MG tablet  Pt has an appointment scheduled on 8/14 to re-establish with a new PCP

## 2016-05-23 ENCOUNTER — Other Ambulatory Visit: Payer: Self-pay | Admitting: Internal Medicine

## 2016-06-05 ENCOUNTER — Encounter: Payer: Self-pay | Admitting: Internal Medicine

## 2016-06-05 ENCOUNTER — Ambulatory Visit: Payer: Self-pay | Attending: Internal Medicine | Admitting: Internal Medicine

## 2016-06-05 VITALS — BP 142/106 | HR 82 | Temp 97.9°F | Resp 16 | Wt 232.4 lb

## 2016-06-05 DIAGNOSIS — M25562 Pain in left knee: Secondary | ICD-10-CM | POA: Insufficient documentation

## 2016-06-05 DIAGNOSIS — I10 Essential (primary) hypertension: Secondary | ICD-10-CM | POA: Insufficient documentation

## 2016-06-05 DIAGNOSIS — H6123 Impacted cerumen, bilateral: Secondary | ICD-10-CM

## 2016-06-05 MED ORDER — HYDROCHLOROTHIAZIDE 25 MG PO TABS: 25.0000 mg | ORAL_TABLET | Freq: Every day | ORAL | 2 refills | Status: DC

## 2016-06-05 MED ORDER — LOSARTAN POTASSIUM 25 MG PO TABS: 25.0000 mg | ORAL_TABLET | Freq: Every day | ORAL | 5 refills | Status: DC

## 2016-06-05 NOTE — Progress Notes (Signed)
Pt is in the office today for establish care Pt is not in any pain

## 2016-06-05 NOTE — Progress Notes (Signed)
Tammy Barajas, is a 50 y.o. female  [id number]  [id number]  DOB - November 02, 1965  CC:  Chief Complaint  Patient presents with  . Establish Care  . Hypertension       HPI: Mikalee Bungard is a 50 y.o. female here today to establish medical care, last seen in clinic 7/16, but pt is adamant she was seen here in April 2017 (I can not find any documentation of it), w/ signif hx of htn, past cocaine abuse (per pt 6 years in remission), and tob abuse.  She states she was seen at [facility] this past July 2017, and had complete blood work as well as pap smear, all normal per pt.   She states she only took cozaar for few days, but had bad headaches so stopped. Only taking mvi and hctz currently. She denies tob, but does smoke Marijuana.  Patient has No headache currently, No chest pain, No abdominal pain - No Nausea, No new weakness tingling or numbness, No Cough - SOB. No visual problems.  Per pt, slipped while walking onto her left knee Monday, and had severe pain and knee swelling. Better overall, but knee still signficantly swollen compared to right knee.  She has been using RICE at home. Able to ambulate w/ slight limp today.    Review of Systems: Per HPI, o/w all systems reviewed and negative.   No Known Allergies Past Medical History:  Diagnosis Date  . Hypertension    Current Outpatient Prescriptions on File Prior to Visit  Medication Sig Dispense Refill  . Multiple Vitamin (MULTIVITAMIN) tablet Take 1 tablet by mouth daily.     No current facility-administered medications on file prior to visit.    Family History  Problem Relation Age of Onset  . Hypertension Mother   . Hypertension Brother    Social History   Social History  . Marital status: Single    Spouse name: N/A  . Number of children: N/A  . Years of education: N/A   Occupational History  . Not on file.   Social History Main Topics  . Smoking status: Never Smoker  . Smokeless  tobacco: Never Used  . Alcohol use Yes     Comment: occassionally  . Drug use:     Frequency: 3.0 times per week    Types: Marijuana     Comment: last use 05/09/15  . Sexual activity: Yes    Birth control/ protection: None, Condom   Other Topics Concern  . Not on file   Social History Narrative  . No narrative on file    Objective:   Vitals:   06/05/16 1503  BP: (!) 142/106  Pulse: 82  Resp: 16  Temp: 97.9 F (36.6 C)    Filed Weights   06/05/16 1503  Weight: 232 lb 6.4 oz (105.4 kg)    BP Readings from Last 3 Encounters:  06/05/16 (!) 142/106  05/10/15 (!) 172/108  07/03/14 (!) 138/95    Physical Exam: Constitutional: Patient appears well-developed and well-nourished. No distress. AAOx3, obese, pleasant. HENT: Normocephalic, atraumatic, External right and left ear normal. Oropharynx is clear and moist.  bilat ear canals w/ cerumin impaction Eyes: Conjunctivae and EOM are normal. PERRL, no scleral icterus. Neck: Normal ROM. Neck supple. No JVD.  CVS: RRR, S1/S2 +, no murmurs, no gallops, no carotid bruit.  Pulmonary: Effort and breath sounds normal, no stridor, rhonchi, wheezes, rales.  Abdominal: Soft. BS +, no distension, tenderness, rebound or guarding.  Musculoskeletal: Normal  range of motion. No edema and no tenderness.   ROM intact noted bilaterally, but slightly less so on left knee. Left knee w/ edema/swelling compared to right knee, but nttp. Able to bear weight. LE: bilat/ no c/c/e, pulses 2+ bilateral. Neuro: Alert. muscle tone coordination wnl. No cranial nerve deficit grossly. Skin: Skin is warm and dry. No rash noted. Not diaphoretic. No erythema. No pallor. Psychiatric: Normal mood and affect. Behavior, judgment, thought content normal.  Lab Results  Component Value Date   WBC 6.4 07/03/2014   HGB 12.2 07/03/2014   HCT 37.2 07/03/2014   MCV 89.4 07/03/2014   PLT 340 07/03/2014   Lab Results  Component Value Date   CREATININE 1.07 05/10/2015    BUN 14 05/10/2015   NA 139 05/10/2015   K 4.3 05/10/2015   CL 105 05/10/2015   CO2 23 05/10/2015    Lab Results  Component Value Date   HGBA1C 5.7 (H) 07/03/2014   Lipid Panel     Component Value Date/Time   CHOL 192 07/03/2014 1234   TRIG 113 07/03/2014 1234   HDL 71 07/03/2014 1234   CHOLHDL 2.7 07/03/2014 1234   VLDL 23 07/03/2014 1234   LDLCALC 98 07/03/2014 1234       Depression screen PHQ 2/9 06/05/2016 05/10/2015 07/03/2014  Decreased Interest 1 0 0  Down, Depressed, Hopeless 1 0 0  PHQ - 2 Score 2 0 0  Altered sleeping 0 - -  Tired, decreased energy 0 - -  Change in appetite 0 - -  Feeling bad or failure about yourself  0 - -  Trouble concentrating 1 - -  Moving slowly or fidgety/restless 0 - -  Suicidal thoughts 0 - -  PHQ-9 Score 3 - -    Assessment and plan:   1. Essential hypertension, uncontrolled - recd low salt/dash diet. -continued hctz 25 qd - losartan (COZAAR) 25 MG tablet; Take 1 tablet (25 mg total) by mouth daily. Start out w/ 1/2 tab day x 4 days  Dispense: 30 tablet; Refill: 5  -  - f/u bp in 2-3 wks  2. Headache w/ cozaar in past - suspect pt has had high bp for so long, that having rebound ha when bp normalizing. - suggest taking 1/2 dose cozaar x 4 days until body acclimated, than increase to full dose. If problems persist, consider hydralazine.  3. Knee pain, acute, left, likely due to knee sprain from fall - rom intake, ambulating ok, RICE recd  4. Ceruminosis, bilateral - Ear Lavage today done pre CMA, appreciate assistance  5. Health maintenance - per pt, has recent extensive labs at [facility], as well as pap smear. We do not have access to records, asked her to bring in the labs reports to when I see her next or fill out release of info ppwk.   Return in about 2 weeks (around 06/19/2016) for htn.  The patient was given clear instructions to go to ER or return to medical center if symptoms don't improve, worsen  or new problems develop. The patient verbalized understanding. The patient was told to call to get lab results if they haven't heard anything in the next week.    This note has been created with Surveyor, quantity. Any transcriptional errors are unintentional.   Maren Reamer, MD, [place], [place]   06/05/2016, 4:59 PM

## 2016-06-05 NOTE — Patient Instructions (Addendum)
* release of information - to be sent to health dept./women's health    Hypertension Hypertension is another name for high blood pressure. High blood pressure forces your heart to work harder to pump blood. A blood pressure reading has two numbers, which includes a higher number over a lower number (example: 110/72). HOME CARE   Have your blood pressure rechecked by your doctor.  Only take medicine as told by your doctor. Follow the directions carefully. The medicine does not work as well if you skip doses. Skipping doses also puts you at risk for problems.  Do not smoke.  Monitor your blood pressure at home as told by your doctor. GET HELP IF:  You think you are having a reaction to the medicine you are taking.  You have repeat headaches or feel dizzy.  You have puffiness (swelling) in your ankles.  You have trouble with your vision. GET HELP RIGHT AWAY IF:   You get a very bad headache and are confused.  You feel weak, numb, or faint.  You get chest or belly (abdominal) pain.  You throw up (vomit).  You cannot breathe very well. MAKE SURE YOU:   Understand these instructions.  Will watch your condition.  Will get help right away if you are not doing well or get worse.   This information is not intended to replace advice given to you by your health care provider. Make sure you discuss any questions you have with your health care provider.   Document Released: 03/27/2008 Document Revised: 10/14/2013 Document Reviewed: 08/01/2013 Elsevier Interactive Patient Education 2016 [place] DASH stands for "Dietary Approaches to Stop Hypertension." The DASH eating plan is a healthy eating plan that has been shown to reduce high blood pressure (hypertension). Additional health benefits may include reducing the risk of type 2 diabetes mellitus, heart disease, and stroke. The DASH eating plan may also help with weight loss. WHAT DO I NEED TO KNOW  ABOUT THE DASH EATING PLAN? For the DASH eating plan, you will follow these general guidelines:  Choose foods with a percent daily value for sodium of less than 5% (as listed on the food label).  Use salt-free seasonings or herbs instead of table salt or sea salt.  Check with your health care provider or pharmacist before using salt substitutes.  Eat lower-sodium products, often labeled as "lower sodium" or "no salt added."  Eat fresh foods.  Eat more vegetables, fruits, and low-fat dairy products.  Choose whole grains. Look for the word "whole" as the first word in the ingredient list.  Choose fish and skinless chicken or [place] more often than red meat. Limit fish, poultry, and meat to 6 oz (170 g) each day.  Limit sweets, desserts, sugars, and sugary drinks.  Choose heart-healthy fats.  Limit cheese to 1 oz (28 g) per day.  Eat more home-cooked food and less restaurant, buffet, and fast food.  Limit fried foods.  Cook foods using methods other than frying.  Limit canned vegetables. If you do use them, rinse them well to decrease the sodium.  When eating at a restaurant, ask that your food be prepared with less salt, or no salt if possible. WHAT FOODS CAN I EAT? Seek help from a dietitian for individual calorie needs. Grains Whole grain or whole wheat bread. Brown rice. Whole grain or whole wheat pasta. Quinoa, bulgur, and whole grain cereals. Low-sodium cereals. Corn or whole wheat flour tortillas. Whole grain cornbread. Whole  grain crackers. Low-sodium crackers. Vegetables Fresh or frozen vegetables (raw, steamed, roasted, or grilled). Low-sodium or reduced-sodium tomato and vegetable juices. Low-sodium or reduced-sodium tomato sauce and paste. Low-sodium or reduced-sodium canned vegetables.  Fruits All fresh, canned (in natural juice), or frozen fruits. Meat and Other Protein Products Ground beef (85% or leaner), grass-fed beef, or beef trimmed of fat. Skinless chicken  or [place]. Ground chicken or [place]. Pork trimmed of fat. All fish and seafood. Eggs. Dried beans, peas, or lentils. Unsalted nuts and seeds. Unsalted canned beans. Dairy Low-fat dairy products, such as skim or 1% milk, 2% or reduced-fat cheeses, low-fat ricotta or cottage cheese, or plain low-fat yogurt. Low-sodium or reduced-sodium cheeses. Fats and Oils Tub margarines without trans fats. Light or reduced-fat mayonnaise and salad dressings (reduced sodium). Avocado. Safflower, olive, or canola oils. Natural peanut or almond butter. Other Unsalted popcorn and pretzels. The items listed above may not be a complete list of recommended foods or beverages. Contact your dietitian for more options. WHAT FOODS ARE NOT RECOMMENDED? Grains White bread. White pasta. White rice. Refined cornbread. Bagels and croissants. Crackers that contain trans fat. Vegetables Creamed or fried vegetables. Vegetables in a cheese sauce. Regular canned vegetables. Regular canned tomato sauce and paste. Regular tomato and vegetable juices. Fruits Dried fruits. Canned fruit in light or heavy syrup. Fruit juice. Meat and Other Protein Products Fatty cuts of meat. Ribs, chicken wings, bacon, sausage, bologna, salami, chitterlings, fatback, hot dogs, bratwurst, and packaged luncheon meats. Salted nuts and seeds. Canned beans with salt. Dairy Whole or 2% milk, cream, half-and-half, and cream cheese. Whole-fat or sweetened yogurt. Full-fat cheeses or blue cheese. Nondairy creamers and whipped toppings. Processed cheese, cheese spreads, or cheese curds. Condiments Onion and garlic salt, seasoned salt, table salt, and sea salt. Canned and packaged gravies. Worcestershire sauce. Tartar sauce. Barbecue sauce. Teriyaki sauce. Soy sauce, including reduced sodium. Steak sauce. Fish sauce. Oyster sauce. Cocktail sauce. Horseradish. Ketchup and mustard. Meat flavorings and tenderizers. Bouillon cubes. Hot sauce. Tabasco sauce. Marinades.  Taco seasonings. Relishes. Fats and Oils Butter, stick margarine, lard, shortening, ghee, and bacon fat. Coconut, palm kernel, or palm oils. Regular salad dressings. Other Pickles and olives. Salted popcorn and pretzels. The items listed above may not be a complete list of foods and beverages to avoid. Contact your dietitian for more information. WHERE CAN I FIND MORE INFORMATION? National Heart, Lung, and Blood Institute: travelstabloid.com   This information is not intended to replace advice given to you by your health care provider. Make sure you discuss any questions you have with your health care provider.   Document Released: 09/28/2011 Document Revised: 10/30/2014 Document Reviewed: 08/13/2013 Elsevier Interactive Patient Education 2016 [place] for Routine Care of Injuries Many injuries can be cared for using rest, ice, compression, and elevation (RICE therapy). Using RICE therapy can help to lessen pain and swelling. It can help your body to heal. Rest Reduce your normal activities and avoid using the injured part of your body. You can go back to your normal activities when you feel okay and your doctor says it is okay. Ice Do not put ice on your bare skin.  Put ice in a plastic bag.  Place a towel between your skin and the bag.  Leave the ice on for 20 minutes, 2-3 times a day. Do this for as long as told by your doctor. Compression Compression means putting pressure on the injured area. This can be done with an  elastic bandage. If an elastic bandage has been applied:  Remove and reapply the bandage every 3-4 hours or as told by your doctor.  Make sure the bandage is not wrapped too tight. Wrap the bandage more loosely if part of your body beyond the bandage is blue, swollen, cold, painful, or loses feeling (numb).  See your doctor if the bandage seems to make your problems worse. Elevation Elevation means keeping the  injured area raised. Raise the injured area above your heart or the center of your chest if you can. WHEN SHOULD I GET HELP? You should get help if:  You keep having pain and swelling.  Your symptoms get worse. WHEN SHOULD I GET HELP RIGHT AWAY? You should get help right away if:  You have sudden bad pain at or below the area of your injury.  You have redness or more swelling around your injury.  You have tingling or numbness at or below the injury that does not go away when you take off the bandage.   This information is not intended to replace advice given to you by your health care provider. Make sure you discuss any questions you have with your health care provider.   Document Released: 03/27/2008 Document Revised: 06/30/2015 Document Reviewed: 09/16/2014 Elsevier Interactive Patient Education 2016 [place] Sprain A knee sprain is a tear in the strong bands of tissue that connect the bones (ligaments) of your knee. HOME CARE  Raise (elevate) your injured knee to lessen puffiness (swelling).  To ease pain and puffiness, put ice on the injured area.  Put ice in a plastic bag.  Place a towel between your skin and the bag.  Leave the ice on for 20 minutes, 2-3 times a day.  Only take medicine as told by your doctor.  Do not leave your knee unprotected until pain and stiffness go away (usually 4-6 weeks).  If you have a cast or splint, do not get it wet. If your doctor told you to not take it off, cover it with a plastic bag when you shower or bathe. Do not swim.  Your doctor may have you do exercises to prevent or limit permanent weakness and stiffness. GET HELP RIGHT AWAY IF:   Your cast or splint becomes damaged.  Your pain gets worse.  You have a lot of pain, puffiness, or numbness below the cast or splint. MAKE SURE YOU:   Understand these instructions.  Will watch your condition.  Will get help right away if you are not doing well or get  worse.   This information is not intended to replace advice given to you by your health care provider. Make sure you discuss any questions you have with your health care provider.   Document Released: 09/27/2009 Document Revised: 10/14/2013 Document Reviewed: 06/17/2013 Elsevier Interactive Patient Education [employer].

## 2016-07-10 ENCOUNTER — Ambulatory Visit: Payer: Self-pay | Admitting: Internal Medicine

## 2016-07-10 ENCOUNTER — Emergency Department (HOSPITAL_COMMUNITY): Payer: Self-pay

## 2016-07-10 ENCOUNTER — Emergency Department (HOSPITAL_COMMUNITY)
Admission: EM | Admit: 2016-07-10 | Discharge: 2016-07-10 | Disposition: A | Discharge status: Home / Self Care | Payer: Self-pay | Attending: Emergency Medicine | Admitting: Emergency Medicine

## 2016-07-10 ENCOUNTER — Encounter (HOSPITAL_COMMUNITY): Payer: Self-pay | Admitting: Emergency Medicine

## 2016-07-10 DIAGNOSIS — W108XXA Fall (on) (from) other stairs and steps, initial encounter: Secondary | ICD-10-CM | POA: Insufficient documentation

## 2016-07-10 DIAGNOSIS — S8992XA Unspecified injury of left lower leg, initial encounter: Secondary | ICD-10-CM | POA: Insufficient documentation

## 2016-07-10 DIAGNOSIS — Y939 Activity, unspecified: Secondary | ICD-10-CM | POA: Insufficient documentation

## 2016-07-10 DIAGNOSIS — Y929 Unspecified place or not applicable: Secondary | ICD-10-CM | POA: Insufficient documentation

## 2016-07-10 DIAGNOSIS — F1290 Cannabis use, unspecified, uncomplicated: Secondary | ICD-10-CM | POA: Insufficient documentation

## 2016-07-10 DIAGNOSIS — Z79899 Other long term (current) drug therapy: Secondary | ICD-10-CM | POA: Insufficient documentation

## 2016-07-10 DIAGNOSIS — I10 Essential (primary) hypertension: Secondary | ICD-10-CM | POA: Insufficient documentation

## 2016-07-10 DIAGNOSIS — Y999 Unspecified external cause status: Secondary | ICD-10-CM | POA: Insufficient documentation

## 2016-07-10 NOTE — ED Triage Notes (Signed)
Pt states that she fell several weeks ago, hitting her L knee. Knee is still swelling. Alert and oriented.

## 2016-07-10 NOTE — ED Provider Notes (Signed)
[facility] DEPT Provider Note   CSN: [id number] Arrival date & time: 07/10/16  1652   By signing my name below, I, Delton Prairie, attest that this documentation has been prepared under the direction and in the presence of  [employer], PA-C. Electronically Signed: Delton Prairie, ED Scribe. 07/10/16. 7:42 PM.   History   Chief Complaint Chief Complaint  Patient presents with  . Knee Injury    The history is provided by the patient. No language interpreter was used.   HPI Comments:  Tammy Barajas is a 50 y.o. female who presents to the Emergency Department complaining of  moderate left knee pain s/p an fall which occured 3 weeks ago. Pt states she missed a step on her stair case, hit her foot causing the injury to her knee. She cannot recall her exact mechanism of how she injured her knee. Pt notes she has been wearing a compression sleeve, applying ice, "green" alcohol, and elevating her knee on the couch with little relief. She states when she walks she hears a "popping" sound. Pt denies any other complaints at this time.   Past Medical History:  Diagnosis Date  . Hypertension     There are no active problems to display for this patient.   History reviewed. No pertinent surgical history.  OB History    Gravida Para Term Preterm AB Living   3       3 0   SAB TAB Ectopic Multiple Live Births   3               Home Medications    Prior to Admission medications   Medication Sig Start Date End Date Taking? Authorizing Provider  hydrochlorothiazide (HYDRODIURIL) 25 MG tablet Take 1 tablet (25 mg total) by mouth daily. 06/05/16   Maren Reamer, MD  losartan (COZAAR) 25 MG tablet Take 1 tablet (25 mg total) by mouth daily. Start out w/ 1/2 tab day x 4 days 06/05/16   Maren Reamer, MD  Multiple Vitamin (MULTIVITAMIN) tablet Take 1 tablet by mouth daily.    Historical Provider, MD    Family History Family History  Problem Relation Age of Onset  . Hypertension  Mother   . Hypertension Brother     Social History Social History  Substance Use Topics  . Smoking status: Never Smoker  . Smokeless tobacco: Never Used  . Alcohol use Yes     Comment: occassionally     Allergies   Review of patient's allergies indicates no known allergies.   Review of Systems Review of Systems 10 systems reviewed and all are negative for acute change except as noted in the HPI.  Physical Exam Updated Vital Signs BP (!) 184/110 (BP Location: Right Arm)   Pulse 91   Temp 97.8 F (36.6 C) (Oral)   Resp 18   LMP 06/23/2016 (Approximate)   SpO2 99%   Physical Exam  Constitutional: She appears well-developed and well-nourished. No distress.  HENT:  Head: Normocephalic and atraumatic.  Neck: Neck supple.  Pulmonary/Chest: Effort normal.  Musculoskeletal: Normal range of motion. She exhibits tenderness. She exhibits no edema.  No obvious swelling or edema to the knee or lower extremity  Full active ROM  Pain with deep flexion  TTP of the anterior knee diffusely   Neurological: She is alert.  Skin: She is not diaphoretic.  No warmth to touch.   Nursing note and vitals reviewed.    ED Treatments / Results  DIAGNOSTIC STUDIES:  Oxygen Saturation is 99% on RA, normal by my interpretation.    COORDINATION OF CARE:  7:34 PM Discussed treatment plan with pt at bedside and pt agreed to plan.  Labs (all labs ordered are listed, but only abnormal results are displayed) Labs Reviewed - No data to display  EKG  EKG Interpretation None       Radiology Dg Knee Complete 4 Views Left  Result Date: 07/10/2016 CLINICAL DATA:  Status post fall.  Anterior knee pain. EXAM: LEFT KNEE - COMPLETE 4+ VIEW COMPARISON:  None. FINDINGS: No fracture or dislocation. Small suprapatellar knee effusion. No evidence of arthropathy or other focal bone abnormality. Soft tissues are unremarkable. IMPRESSION: Small suprapatellar effusion without acute fracture or  dislocation. Electronically Signed   By: Ulyses Jarred M.D.   On: 07/10/2016 18:15    Procedures Procedures (including critical care time)  Medications Ordered in ED Medications - No data to display   Initial Impression / Assessment and Plan / ED Course  I have reviewed the triage vital signs and the nursing notes.  Pertinent labs & imaging results that were available during my care of the patient were reviewed by me and considered in my medical decision making (see chart for details).  Clinical Course    Labs:   Imaging:   Consults:   Therapeutics:   Discharge Meds:  Assessment/Plan: 50 year old female presents today with left knee pain. She has small amount of swelling. Likely strain. She will be referred to follow-up with orthopedist.   Final Clinical Impressions(s) / ED Diagnoses   Final diagnoses:  Left knee injury, initial encounter    New Prescriptions Discharge Medication List as of 07/10/2016  7:45 PM    I personally performed the services described in this documentation, which was scribed in my presence. The recorded information has been reviewed and is accurate.    Okey Regal, PA-C 07/10/16 2016    Sharlett Iles, MD 07/11/16 1800

## 2016-07-10 NOTE — Discharge Instructions (Signed)
Please follow-up with orthopedist as directed.

## 2016-07-20 ENCOUNTER — Encounter: Payer: Self-pay | Admitting: Internal Medicine

## 2016-07-20 ENCOUNTER — Ambulatory Visit: Payer: Self-pay | Attending: Internal Medicine | Admitting: Internal Medicine

## 2016-07-20 VITALS — BP 142/39 | HR 74 | Temp 97.8°F | Ht 68.0 in | Wt 226.0 lb

## 2016-07-20 DIAGNOSIS — F1210 Cannabis abuse, uncomplicated: Secondary | ICD-10-CM

## 2016-07-20 DIAGNOSIS — Z1239 Encounter for other screening for malignant neoplasm of breast: Secondary | ICD-10-CM

## 2016-07-20 DIAGNOSIS — Z23 Encounter for immunization: Secondary | ICD-10-CM

## 2016-07-20 DIAGNOSIS — I10 Essential (primary) hypertension: Secondary | ICD-10-CM

## 2016-07-20 MED ORDER — HYDROCHLOROTHIAZIDE 25 MG PO TABS: 25.0000 mg | ORAL_TABLET | Freq: Every day | ORAL | 2 refills | Status: DC

## 2016-07-20 MED ORDER — LOSARTAN POTASSIUM 25 MG PO TABS: 25.0000 mg | ORAL_TABLET | Freq: Every day | ORAL | 5 refills | Status: DC

## 2016-07-20 NOTE — Patient Instructions (Signed)
DASH Eating Plan DASH stands for "Dietary Approaches to Stop Hypertension." The DASH eating plan is a healthy eating plan that has been shown to reduce high blood pressure (hypertension). Additional health benefits may include reducing the risk of type 2 diabetes mellitus, heart disease, and stroke. The DASH eating plan may also help with weight loss. WHAT DO I NEED TO KNOW ABOUT THE DASH EATING PLAN? For the DASH eating plan, you will follow these general guidelines:  Choose foods with a percent daily value for sodium of less than 5% (as listed on the food label).  Use salt-free seasonings or herbs instead of table salt or sea salt.  Check with your health care provider or pharmacist before using salt substitutes.  Eat lower-sodium products, often labeled as "lower sodium" or "no salt added."  Eat fresh foods.  Eat more vegetables, fruits, and low-fat dairy products.  Choose whole grains. Look for the word "whole" as the first word in the ingredient list.  Choose fish and skinless chicken or [place] more often than red meat. Limit fish, poultry, and meat to 6 oz (170 g) each day.  Limit sweets, desserts, sugars, and sugary drinks.  Choose heart-healthy fats.  Limit cheese to 1 oz (28 g) per day.  Eat more home-cooked food and less restaurant, buffet, and fast food.  Limit fried foods.  Cook foods using methods other than frying.  Limit canned vegetables. If you do use them, rinse them well to decrease the sodium.  When eating at a restaurant, ask that your food be prepared with less salt, or no salt if possible. WHAT FOODS CAN I EAT? Seek help from a dietitian for individual calorie needs. Grains Whole grain or whole wheat bread. Brown rice. Whole grain or whole wheat pasta. Quinoa, bulgur, and whole grain cereals. Low-sodium cereals. Corn or whole wheat flour tortillas. Whole grain cornbread. Whole grain crackers. Low-sodium crackers. Vegetables Fresh or frozen vegetables  (raw, steamed, roasted, or grilled). Low-sodium or reduced-sodium tomato and vegetable juices. Low-sodium or reduced-sodium tomato sauce and paste. Low-sodium or reduced-sodium canned vegetables.  Fruits All fresh, canned (in natural juice), or frozen fruits. Meat and Other Protein Products Ground beef (85% or leaner), grass-fed beef, or beef trimmed of fat. Skinless chicken or turkey. Ground chicken or turkey. Pork trimmed of fat. All fish and seafood. Eggs. Dried beans, peas, or lentils. Unsalted nuts and seeds. Unsalted canned beans. Dairy Low-fat dairy products, such as skim or 1% milk, 2% or reduced-fat cheeses, low-fat ricotta or cottage cheese, or plain low-fat yogurt. Low-sodium or reduced-sodium cheeses. Fats and Oils Tub margarines without trans fats. Light or reduced-fat mayonnaise and salad dressings (reduced sodium). Avocado. Safflower, olive, or canola oils. Natural peanut or almond butter. Other Unsalted popcorn and pretzels. The items listed above may not be a complete list of recommended foods or beverages. Contact your dietitian for more options. WHAT FOODS ARE NOT RECOMMENDED? Grains White bread. White pasta. White rice. Refined cornbread. Bagels and croissants. Crackers that contain trans fat. Vegetables Creamed or fried vegetables. Vegetables in a cheese sauce. Regular canned vegetables. Regular canned tomato sauce and paste. Regular tomato and vegetable juices. Fruits Dried fruits. Canned fruit in light or heavy syrup. Fruit juice. Meat and Other Protein Products Fatty cuts of meat. Ribs, chicken wings, bacon, sausage, bologna, salami, chitterlings, fatback, hot dogs, bratwurst, and packaged luncheon meats. Salted nuts and seeds. Canned beans with salt. Dairy Whole or 2% milk, cream, half-and-half, and cream cheese. Whole-fat or sweetened yogurt. Full-fat   cheeses or blue cheese. Nondairy creamers and whipped toppings. Processed cheese, cheese spreads, or cheese  curds. °Condiments °Onion and garlic salt, seasoned salt, table salt, and sea salt. Canned and packaged gravies. Worcestershire sauce. Tartar sauce. Barbecue sauce. Teriyaki sauce. Soy sauce, including reduced sodium. Steak sauce. Fish sauce. Oyster sauce. Cocktail sauce. Horseradish. Ketchup and mustard. Meat flavorings and tenderizers. Bouillon cubes. Hot sauce. Tabasco sauce. Marinades. Taco seasonings. Relishes. °Fats and Oils °Butter, stick margarine, lard, shortening, ghee, and bacon fat. Coconut, palm kernel, or palm oils. Regular salad dressings. °Other °Pickles and olives. Salted popcorn and pretzels. °The items listed above may not be a complete list of foods and beverages to avoid. Contact your dietitian for more information. °WHERE CAN I FIND MORE INFORMATION? °National Heart, Lung, and Blood Institute: www.nhlbi.nih.gov/health/health-topics/topics/dash/ °  °This information is not intended to replace advice given to you by your health care provider. Make sure you discuss any questions you have with your health care provider. °  °Document Released: 09/28/2011 Document Revised: 10/30/2014 Document Reviewed: 08/13/2013 °Elsevier Interactive Patient Education ©2016 Elsevier Inc. ° °Hypertension °Hypertension is another name for high blood pressure. High blood pressure forces your heart to work harder to pump blood. A blood pressure reading has two numbers, which includes a higher number over a lower number (example: 110/72). °HOME CARE  °· Have your blood pressure rechecked by your doctor. °· Only take medicine as told by your doctor. Follow the directions carefully. The medicine does not work as well if you skip doses. Skipping doses also puts you at risk for problems. °· Do not smoke. °· Monitor your blood pressure at home as told by your doctor. °GET HELP IF: °· You think you are having a reaction to the medicine you are taking. °· You have repeat headaches or feel dizzy. °· You have puffiness (swelling)  in your ankles. °· You have trouble with your vision. °GET HELP RIGHT AWAY IF:  °· You get a very bad headache and are confused. °· You feel weak, numb, or faint. °· You get chest or belly (abdominal) pain. °· You throw up (vomit). °· You cannot breathe very well. °MAKE SURE YOU:  °· Understand these instructions. °· Will watch your condition. °· Will get help right away if you are not doing well or get worse. °  °This information is not intended to replace advice given to you by your health care provider. Make sure you discuss any questions you have with your health care provider. °  °Document Released: 03/27/2008 Document Revised: 10/14/2013 Document Reviewed: 08/01/2013 °Elsevier Interactive Patient Education ©2016 Elsevier Inc. ° °

## 2016-07-20 NOTE — Progress Notes (Signed)
Tammy Barajas, is a 50 y.o. female  [id number]  PH:[id number]  DOB - 1966-02-13  Chief Complaint  Patient presents with  . Hypertension        Subjective:   Shauntrice Grabe is a 50 y.o. female here today for a follow up visit of htn. Pt has been doing well taking 1/2 cozaar (12.5 mg daily)  and hctz 25mg  qd w/o problems. She states she has problem w/ salt intake though, and admits to having to wk harder on that.  Denies tob, accessional marijuana  She saw Ortho this Monday, they aspirated her left knee and gave her a steroid injection. Her knee feels a lot better overall. Will f/u w/ them in about 4 months or so.  Patient has No headache, No chest pain, No abdominal pain - No Nausea, No new weakness tingling or numbness, No Cough - SOB.  No problems updated.  ALLERGIES: No Known Allergies  PAST MEDICAL HISTORY: Past Medical History:  Diagnosis Date  . Hypertension     MEDICATIONS AT HOME: Prior to Admission medications   Medication Sig Start Date End Date Taking? Authorizing Provider  aspirin 325 MG tablet Take 325 mg by mouth daily.   Yes Historical Provider, MD  hydrochlorothiazide (HYDRODIURIL) 25 MG tablet Take 1 tablet (25 mg total) by mouth daily. 07/20/16   Maren Reamer, MD  losartan (COZAAR) 25 MG tablet Take 1 tablet (25 mg total) by mouth daily. 07/20/16   Maren Reamer, MD  Multiple Vitamin (MULTIVITAMIN) tablet Take 1 tablet by mouth daily.    Historical Provider, MD     Objective:   Vitals:   07/20/16 1134  BP: (!) 142/39  Pulse: 74  Temp: 97.8 F (36.6 C)  TempSrc: Oral  Weight: 226 lb (102.5 kg)  Height: 5\' 8"  (1.727 m)    Exam General appearance : Awake, alert, not in any distress. Speech Clear. Not toxic looking, obese, pleasant. HEENT: Atraumatic and Normocephalic, pupils equally reactive to light. Neck: supple, no JVD.  Chest:Good air entry bilaterally, no added sounds. CVS: S1 S2 regular, no murmurs/gallups or  rubs. Abdomen: Bowel sounds active, Non tender and not distended with no gaurding, rigidity or rebound. Extremities: B/L Lower Ext shows no edema, both legs are warm to touch.  Left knee w/o effusions, nttp, good ROM today. Neurology: Awake alert, and oriented X 3, CN II-XII grossly intact, Non focal Skin:No Rash  Data Review Lab Results  Component Value Date   HGBA1C 5.7 (H) 07/03/2014    Depression screen PHQ 2/9 06/05/2016 05/10/2015 07/03/2014  Decreased Interest 1 0 0  Down, Depressed, Hopeless 1 0 0  PHQ - 2 Score 2 0 0  Altered sleeping 0 - -  Tired, decreased energy 0 - -  Change in appetite 0 - -  Feeling bad or failure about yourself  0 - -  Trouble concentrating 1 - -  Moving slowly or fidgety/restless 0 - -  Suicidal thoughts 0 - -  PHQ-9 Score 3 - -    Left knee xrays 07/10/16 CLINICAL DATA:  Status post fall.  Anterior knee pain.  EXAM: LEFT KNEE - COMPLETE 4+ VIEW  COMPARISON:  None.  FINDINGS: No fracture or dislocation. Small suprapatellar knee effusion. No evidence of arthropathy or other focal bone abnormality. Soft tissues are unremarkable.  IMPRESSION: Small suprapatellar effusion without acute fracture or dislocation.   Electronically Signed   By: Ulyses Jarred M.D.   On: 07/10/2016 18:15   Assessment &  Plan   1. Essential hypertension Remains slightly elevated, recd DASH diet, again discussed this w/ pt. - asked her to try taking cozaar entire tab 25 qday. Watch for HA, she had it in past w/ this. If no HAs, encouraged her to continue dose. - losartan (COZAAR) 25 MG tablet; Take 1 tablet (25 mg total) by mouth daily.  Dispense: 30 tablet; Refill: 5  2. Breast cancer screening - MM Digital Screening; Future (per pt, gets scholarship at [facility])  3. Marijuana use/abuse Encouraged complete cessation  4. Left knee effusions - sp aspiration 9/25 at ortho and steroid injection, doing better overall  5. tdap today  6. Pt  declined flu shot, encouraged to get, but she is scared.  Patient have been counseled extensively about nutrition and exercise  Return in about 2 months (around 09/19/2016).  The patient was given clear instructions to go to ER or return to medical center if symptoms don't improve, worsen or new problems develop. The patient verbalized understanding. The patient was told to call to get lab results if they haven't heard anything in the next week.   This note has been created with Surveyor, quantity. Any transcriptional errors are unintentional.   Maren Reamer, MD, [place] [facility] [place], [place]   07/20/2016, 12:17 PM

## 2016-07-27 ENCOUNTER — Ambulatory Visit: Payer: Self-pay

## 2017-09-03 ENCOUNTER — Ambulatory Visit: Payer: Self-pay | Attending: Nurse Practitioner | Admitting: Nurse Practitioner

## 2017-09-03 ENCOUNTER — Encounter: Payer: Self-pay | Admitting: Nurse Practitioner

## 2017-09-03 ENCOUNTER — Ambulatory Visit
Admission: RE | Admit: 2017-09-03 | Discharge: 2017-09-03 | Disposition: A | Discharge status: Home / Self Care | Payer: No Typology Code available for payment source | Source: Ambulatory Visit | Attending: Nurse Practitioner | Admitting: Nurse Practitioner

## 2017-09-03 VITALS — BP 160/98 | HR 90 | Temp 97.5°F | Resp 18 | Ht 68.0 in | Wt 227.2 lb

## 2017-09-03 DIAGNOSIS — Z1231 Encounter for screening mammogram for malignant neoplasm of breast: Secondary | ICD-10-CM

## 2017-09-03 DIAGNOSIS — Z760 Encounter for issue of repeat prescription: Secondary | ICD-10-CM | POA: Insufficient documentation

## 2017-09-03 DIAGNOSIS — D170 Benign lipomatous neoplasm of skin and subcutaneous tissue of head, face and neck: Secondary | ICD-10-CM | POA: Insufficient documentation

## 2017-09-03 DIAGNOSIS — I10 Essential (primary) hypertension: Secondary | ICD-10-CM | POA: Insufficient documentation

## 2017-09-03 DIAGNOSIS — Z79899 Other long term (current) drug therapy: Secondary | ICD-10-CM | POA: Insufficient documentation

## 2017-09-03 DIAGNOSIS — Z7982 Long term (current) use of aspirin: Secondary | ICD-10-CM | POA: Insufficient documentation

## 2017-09-03 LAB — HEMOGLOBIN A1C: Hemoglobin A1C: 5.3

## 2017-09-03 MED ORDER — LOSARTAN POTASSIUM 25 MG PO TABS: 25.0000 mg | ORAL_TABLET | Freq: Every day | ORAL | 1 refills | Status: DC

## 2017-09-03 MED ORDER — HYDROCHLOROTHIAZIDE 25 MG PO TABS: 25.0000 mg | ORAL_TABLET | Freq: Every day | ORAL | 1 refills | Status: DC

## 2017-09-03 NOTE — Patient Instructions (Addendum)
DASH Eating Plan DASH stands for "Dietary Approaches to Stop Hypertension." The DASH eating plan is a healthy eating plan that has been shown to reduce high blood pressure (hypertension). It may also reduce your risk for type 2 diabetes, heart disease, and stroke. The DASH eating plan may also help with weight loss. What are tips for following this plan? General guidelines  Avoid eating more than 2,300 mg (milligrams) of salt (sodium) a day. If you have hypertension, you may need to reduce your sodium intake to 1,500 mg a day.  Limit alcohol intake to no more than 1 drink a day for nonpregnant women and 2 drinks a day for men. One drink equals 12 oz of beer, 5 oz of wine, or 1 oz of hard liquor.  Work with your health care provider to maintain a healthy body weight or to lose weight. Ask what an ideal weight is for you.  Get at least 30 minutes of exercise that causes your heart to beat faster (aerobic exercise) most days of the week. Activities may include walking, swimming, or biking.  Work with your health care provider or diet and nutrition specialist (dietitian) to adjust your eating plan to your individual calorie needs. Reading food labels  Check food labels for the amount of sodium per serving. Choose foods with less than 5 percent of the Daily Value of sodium. Generally, foods with less than 300 mg of sodium per serving fit into this eating plan.  To find whole grains, look for the word "whole" as the first word in the ingredient list. Shopping  Buy products labeled as "low-sodium" or "no salt added."  Buy fresh foods. Avoid canned foods and premade or frozen meals. Cooking  Avoid adding salt when cooking. Use salt-free seasonings or herbs instead of table salt or sea salt. Check with your health care provider or pharmacist before using salt substitutes.  Do not fry foods. Cook foods using healthy methods such as baking, boiling, grilling, and broiling instead.  Cook with  heart-healthy oils, such as olive, canola, soybean, or sunflower oil. Meal planning   Eat a balanced diet that includes: ? 5 or more servings of fruits and vegetables each day. At each meal, try to fill half of your plate with fruits and vegetables. ? Up to 6-8 servings of whole grains each day. ? Less than 6 oz of lean meat, poultry, or fish each day. A 3-oz serving of meat is about the same size as a deck of cards. One egg equals 1 oz. ? 2 servings of low-fat dairy each day. ? A serving of nuts, seeds, or beans 5 times each week. ? Heart-healthy fats. Healthy fats called Omega-3 fatty acids are found in foods such as flaxseeds and coldwater fish, like sardines, salmon, and mackerel.  Limit how much you eat of the following: ? Canned or prepackaged foods. ? Food that is high in trans fat, such as fried foods. ? Food that is high in saturated fat, such as fatty meat. ? Sweets, desserts, sugary drinks, and other foods with added sugar. ? Full-fat dairy products.  Do not salt foods before eating.  Try to eat at least 2 vegetarian meals each week.  Eat more home-cooked food and less restaurant, buffet, and fast food.  When eating at a restaurant, ask that your food be prepared with less salt or no salt, if possible. What foods are recommended? The items listed may not be a complete list. Talk with your dietitian about what   dietary choices are best for you. Grains Whole-grain or whole-wheat bread. Whole-grain or whole-wheat pasta. Brown rice. Oatmeal. Quinoa. Bulgur. Whole-grain and low-sodium cereals. Pita bread. Low-fat, low-sodium crackers. Whole-wheat flour tortillas. Vegetables Fresh or frozen vegetables (raw, steamed, roasted, or grilled). Low-sodium or reduced-sodium tomato and vegetable juice. Low-sodium or reduced-sodium tomato sauce and tomato paste. Low-sodium or reduced-sodium canned vegetables. Fruits All fresh, dried, or frozen fruit. Canned fruit in natural juice (without  added sugar). Meat and other protein foods Skinless chicken or turkey. Ground chicken or turkey. Pork with fat trimmed off. Fish and seafood. Egg whites. Dried beans, peas, or lentils. Unsalted nuts, nut butters, and seeds. Unsalted canned beans. Lean cuts of beef with fat trimmed off. Low-sodium, lean deli meat. Dairy Low-fat (1%) or fat-free (skim) milk. Fat-free, low-fat, or reduced-fat cheeses. Nonfat, low-sodium ricotta or cottage cheese. Low-fat or nonfat yogurt. Low-fat, low-sodium cheese. Fats and oils Soft margarine without trans fats. Vegetable oil. Low-fat, reduced-fat, or light mayonnaise and salad dressings (reduced-sodium). Canola, safflower, olive, soybean, and sunflower oils. Avocado. Seasoning and other foods Herbs. Spices. Seasoning mixes without salt. Unsalted popcorn and pretzels. Fat-free sweets. What foods are not recommended? The items listed may not be a complete list. Talk with your dietitian about what dietary choices are best for you. Grains Baked goods made with fat, such as croissants, muffins, or some breads. Dry pasta or rice meal packs. Vegetables Creamed or fried vegetables. Vegetables in a cheese sauce. Regular canned vegetables (not low-sodium or reduced-sodium). Regular canned tomato sauce and paste (not low-sodium or reduced-sodium). Regular tomato and vegetable juice (not low-sodium or reduced-sodium). Pickles. Olives. Fruits Canned fruit in a light or heavy syrup. Fried fruit. Fruit in cream or butter sauce. Meat and other protein foods Fatty cuts of meat. Ribs. Fried meat. Bacon. Sausage. Bologna and other processed lunch meats. Salami. Fatback. Hotdogs. Bratwurst. Salted nuts and seeds. Canned beans with added salt. Canned or smoked fish. Whole eggs or egg yolks. Chicken or turkey with skin. Dairy Whole or 2% milk, cream, and half-and-half. Whole or full-fat cream cheese. Whole-fat or sweetened yogurt. Full-fat cheese. Nondairy creamers. Whipped toppings.  Processed cheese and cheese spreads. Fats and oils Butter. Stick margarine. Lard. Shortening. Ghee. Bacon fat. Tropical oils, such as coconut, palm kernel, or palm oil. Seasoning and other foods Salted popcorn and pretzels. Onion salt, garlic salt, seasoned salt, table salt, and sea salt. Worcestershire sauce. Tartar sauce. Barbecue sauce. Teriyaki sauce. Soy sauce, including reduced-sodium. Steak sauce. Canned and packaged gravies. Fish sauce. Oyster sauce. Cocktail sauce. Horseradish that you find on the shelf. Ketchup. Mustard. Meat flavorings and tenderizers. Bouillon cubes. Hot sauce and Tabasco sauce. Premade or packaged marinades. Premade or packaged taco seasonings. Relishes. Regular salad dressings. Where to find more information:  National Heart, Lung, and Blood Institute: www.nhlbi.nih.gov  American Heart Association: www.heart.org Summary  The DASH eating plan is a healthy eating plan that has been shown to reduce high blood pressure (hypertension). It may also reduce your risk for type 2 diabetes, heart disease, and stroke.  With the DASH eating plan, you should limit salt (sodium) intake to 2,300 mg a day. If you have hypertension, you may need to reduce your sodium intake to 1,500 mg a day.  When on the DASH eating plan, aim to eat more fresh fruits and vegetables, whole grains, lean proteins, low-fat dairy, and heart-healthy fats.  Work with your health care provider or diet and nutrition specialist (dietitian) to adjust your eating plan to your individual   calorie needs. This information is not intended to replace advice given to you by your health care provider. Make sure you discuss any questions you have with your health care provider. Document Released: 09/28/2011 Document Revised: 10/02/2016 Document Reviewed: 10/02/2016 Elsevier Interactive Patient Education  2017 Elsevier Inc. Hypertension Hypertension is another name for high blood pressure. High blood pressure forces  your heart to work harder to pump blood. This can cause problems over time. There are two numbers in a blood pressure reading. There is a top number (systolic) over a bottom number (diastolic). It is best to have a blood pressure below 120/80. Healthy choices can help lower your blood pressure. You may need medicine to help lower your blood pressure if:  Your blood pressure cannot be lowered with healthy choices.  Your blood pressure is higher than 130/80.  Follow these instructions at home: Eating and drinking  If directed, follow the DASH eating plan. This diet includes: ? Filling half of your plate at each meal with fruits and vegetables. ? Filling one quarter of your plate at each meal with whole grains. Whole grains include whole wheat pasta, brown rice, and whole grain bread. ? Eating or drinking low-fat dairy products, such as skim milk or low-fat yogurt. ? Filling one quarter of your plate at each meal with low-fat (lean) proteins. Low-fat proteins include fish, skinless chicken, eggs, beans, and tofu. ? Avoiding fatty meat, cured and processed meat, or chicken with skin. ? Avoiding premade or processed food.  Eat less than 1,500 mg of salt (sodium) a day.  Limit alcohol use to no more than 1 drink a day for nonpregnant women and 2 drinks a day for men. One drink equals 12 oz of beer, 5 oz of wine, or 1 oz of hard liquor. Lifestyle  Work with your doctor to stay at a healthy weight or to lose weight. Ask your doctor what the best weight is for you.  Get at least 30 minutes of exercise that causes your heart to beat faster (aerobic exercise) most days of the week. This may include walking, swimming, or biking.  Get at least 30 minutes of exercise that strengthens your muscles (resistance exercise) at least 3 days a week. This may include lifting weights or pilates.  Do not use any products that contain nicotine or tobacco. This includes cigarettes and e-cigarettes. If you need  help quitting, ask your doctor.  Check your blood pressure at home as told by your doctor.  Keep all follow-up visits as told by your doctor. This is important. Medicines  Take over-the-counter and prescription medicines only as told by your doctor. Follow directions carefully.  Do not skip doses of blood pressure medicine. The medicine does not work as well if you skip doses. Skipping doses also puts you at risk for problems.  Ask your doctor about side effects or reactions to medicines that you should watch for. Contact a doctor if:  You think you are having a reaction to the medicine you are taking.  You have headaches that keep coming back (recurring).  You feel dizzy.  You have swelling in your ankles.  You have trouble with your vision. Get help right away if:  You get a very bad headache.  You start to feel confused.  You feel weak or numb.  You feel faint.  You get very bad pain in your: ? Chest. ? Belly (abdomen).  You throw up (vomit) more than once.  You have trouble breathing. Summary    Summary  Hypertension is another name for high blood pressure.  Making healthy choices can help lower blood pressure. If your blood pressure cannot be controlled with healthy choices, you may need to take medicine. This information is not intended to replace advice given to you by your health care provider. Make sure you discuss any questions you have with your health care provider. Document Released: 03/27/2008 Document Revised: 09/06/2016 Document Reviewed: 09/06/2016 Elsevier Interactive Patient Education  2018 [employer].  Preventing Hypertension Hypertension, commonly called high blood pressure, is when the force of blood pumping through the arteries is too strong. Arteries are blood vessels that carry blood from the heart throughout the body. Over time, hypertension can damage the arteries and decrease blood flow to important parts of the body, including the brain,  heart, and kidneys. Often, hypertension does not cause symptoms until blood pressure is very high. For this reason, it is important to have your blood pressure checked on a regular basis. Hypertension can often be prevented with diet and lifestyle changes. If you already have hypertension, you can control it with diet and lifestyle changes, as well as medicine. What nutrition changes can be made? Maintain a healthy diet. This includes:  Eating less salt (sodium). Ask your health care provider how much sodium is safe for you to have. The general recommendation is to consume less than 1 tsp (2,300 mg) of sodium a day. ? Do not add salt to your food. ? Choose low-sodium options when grocery shopping and eating out.  Limiting fats in your diet. You can do this by eating low-fat or fat-free dairy products and by eating less red meat.  Eating more fruits, vegetables, and whole grains. Make a goal to eat: ? 1-2 cups of fresh fruits and vegetables each day. ? 3-4 servings of whole grains each day.  Avoiding foods and beverages that have added sugars.  Eating fish that contain healthy fats (omega-3 fatty acids), such as mackerel or salmon.  If you need help putting together a healthy eating plan, try the DASH diet. This diet is high in fruits, vegetables, and whole grains. It is low in sodium, red meat, and added sugars. DASH stands for Dietary Approaches to Stop Hypertension. What lifestyle changes can be made?  Lose weight if you are overweight. Losing just 3?5% of your body weight can help prevent or control hypertension. ? For example, if your present weight is 200 lb (91 kg), a loss of 3-5% of your weight means losing 6-10 lb (2.7-4.5 kg). ? Ask your health care provider to help you with a diet and exercise plan to safely lose weight.  Get enough exercise. Do at least 150 minutes of moderate-intensity exercise each week. ? You could do this in short exercise sessions several times a day, or  you could do longer exercise sessions a few times a week. For example, you could take a brisk 10-minute walk or bike ride, 3 times a day, for 5 days a week.  Find ways to reduce stress, such as exercising, meditating, listening to music, or taking a yoga class. If you need help reducing stress, ask your health care provider.  Do not smoke. This includes e-cigarettes. Chemicals in tobacco and nicotine products raise your blood pressure each time you smoke. If you need help quitting, ask your health care provider.  Avoid alcohol. If you drink alcohol, limit alcohol intake to no more than 1 drink a day for nonpregnant women and 2 drinks a day for  men. One drink equals 12 oz of beer, 5 oz of wine, or 1 oz of hard liquor. Why are these changes important? Diet and lifestyle changes can help you prevent hypertension, and they may make you feel better overall and improve your quality of life. If you have hypertension, making these changes will help you control it and help prevent major complications, such as:  Hardening and narrowing of arteries that supply blood to: ? Your heart. This can cause a heart attack. ? Your brain. This can cause a stroke. ? Your kidneys. This can cause kidney failure.  Stress on your heart muscle, which can cause heart failure.  What can I do to lower my risk?  Work with your health care provider to make a hypertension prevention plan that works for you. Follow your plan and keep all follow-up visits as told by your health care provider.  Learn how to check your blood pressure at home. Make sure that you know your personal target blood pressure, as told by your health care provider. How is this treated? In addition to diet and lifestyle changes, your health care provider may recommend medicines to help lower your blood pressure. You may need to try a few different medicines to find what works best for you. You also may need to take more than one medicine. Take  over-the-counter and prescription medicines only as told by your health care provider. Where to find support: Your health care provider can help you prevent hypertension and help you keep your blood pressure at a healthy level. Your local hospital or your community may also provide support services and prevention programs. The American Heart Association offers an online support network at: CheapBootlegs.com.cy Where to find more information: Learn more about hypertension from:  National Heart, Lung, and Blood Institute: ElectronicHangman.is  Centers for Disease Control and Prevention: https://ingram.com/  American Academy of Family Physicians: http://familydoctor.org/familydoctor/en/diseases-conditions/high-blood-pressure.printerview.all.html  Learn more about the DASH diet from:  [place], Lung, and [place]: https://www.reyes.com/  Contact a health care provider if:  You think you are having a reaction to medicines you have taken.  You have recurrent headaches or feel dizzy.  You have swelling in your ankles.  You have trouble with your vision. Summary  Hypertension often does not cause any symptoms until blood pressure is very high. It is important to get your blood pressure checked regularly.  Diet and lifestyle changes are the most important steps in preventing hypertension.  By keeping your blood pressure in a healthy range, you can prevent complications like heart attack, heart failure, stroke, and kidney failure.  Work with your health care provider to make a hypertension prevention plan that works for you. This information is not intended to replace advice given to you by your health care provider. Make sure you discuss any questions you have with your health care provider. Document Released: 10/24/2015 Document Revised: 06/19/2016 Document Reviewed:  06/19/2016 Elsevier Interactive Patient Education  2017 [employer].

## 2017-09-03 NOTE — Progress Notes (Signed)
Assessment & Plan:  Tammy Barajas was seen today for establish care.  Diagnoses and all orders for this visit:  Essential hypertension -     Hemoglobin A1c -     hydrochlorothiazide (HYDRODIURIL) 25 MG tablet; Take 1 tablet (25 mg total) daily by mouth. -     losartan (COZAAR) 25 MG tablet; Take 1 tablet (25 mg total) daily by mouth. -     Lipid panel -     CMP and Liver -     CBC  Lipoma of face -     Ambulatory referral to General Surgery  Breast cancer screening by mammogram -     MM DIGITAL SCREENING BILATERAL; Future     Subjective:   Chief Complaint  Patient presents with  . Establish Care    Patient is here for medication refill. Patient did not take any medication this morning for her blood pressure. She took it last night and took NyQuil.    HPI Tammy Barajas 51 y.o. female presents to office today for follow up of HTN and with concerns of a nodule on her forehead.   Hypertension Her blood pressure is poorly controlled today.  She endorses noncompliance with taking her losartan at times.  States "I do not like the way it makes me feel sometimes".  She does endorse medication compliance with her hydrochlorothiazide.  She does not check her blood pressure at home.  She is not diet compliant.  She declines starting Hyzaar today.  I did discuss with her there is a strong likelihood that I will need to start her on Hyzaar if her blood pressure continues to be increased at her next follow-up visit in a few weeks.  She verbalized understanding.  She endorses smoking marijuana recreationally and has a previous history of cocaine use.  Today she denies any recent cocaine use or smoking cigarettes.  BP Readings from Last 3 Encounters:  09/03/17 (!) 160/98  07/20/16 (!) 142/39  07/10/16 (!) 184/110    Skin Nodule Patient complains of skin nodule. The nodule is located on the forehead. It has been present several months. Pain is rated 0/10. Interventions to date: none. Patient  denies tobacco use. Patient does not have a history of diabetes. She denies any injury or trauma to her face or head.      Past Medical History:  Diagnosis Date  . Hypertension     History reviewed. No pertinent surgical history.  Family History  Problem Relation Age of Onset  . Hypertension Mother   . Hypertension Brother     Social History   Socioeconomic History  . Marital status: Single    Spouse name: Not on file  . Number of children: Not on file  . Years of education: Not on file  . Highest education level: Not on file  Social Needs  . Financial resource strain: Not on file  . Food insecurity - worry: Not on file  . Food insecurity - inability: Not on file  . Transportation needs - medical: Not on file  . Transportation needs - non-medical: Not on file  Occupational History  . Not on file  Tobacco Use  . Smoking status: Never Smoker  . Smokeless tobacco: Never Used  Substance and Sexual Activity  . Alcohol use: Yes    Comment: occassionally  . Drug use: Yes    Frequency: 3.0 times per week    Types: Marijuana    Comment: last use 05/09/15  . Sexual activity:  Yes    Birth control/protection: None, Condom  Other Topics Concern  . Not on file  Social History Narrative  . Not on file    Outpatient Medications Prior to Visit  Medication Sig Dispense Refill  . Multiple Vitamin (MULTIVITAMIN) tablet Take 1 tablet by mouth daily.    . hydrochlorothiazide (HYDRODIURIL) 25 MG tablet Take 1 tablet (25 mg total) by mouth daily. 90 tablet 2  . losartan (COZAAR) 25 MG tablet Take 1 tablet (25 mg total) by mouth daily. 30 tablet 5  . aspirin 325 MG tablet Take 325 mg by mouth daily.     No facility-administered medications prior to visit.     No Known Allergies  Review of Systems  Constitutional: Negative for fever, malaise/fatigue and weight loss.  HENT: Negative.  Negative for nosebleeds.   Eyes: Negative.  Negative for blurred vision, double vision and  photophobia.  Respiratory: Negative.  Negative for cough and shortness of breath.   Cardiovascular: Negative.  Negative for chest pain, palpitations and leg swelling.  Gastrointestinal: Negative.  Negative for abdominal pain, constipation, diarrhea, heartburn, nausea and vomiting.  Musculoskeletal: Negative.  Negative for myalgias.  Skin:       Nodule   Neurological: Negative.  Negative for dizziness, focal weakness, seizures and headaches.  Endo/Heme/Allergies: Negative for environmental allergies.  Psychiatric/Behavioral: Negative.  Negative for suicidal ideas.       Objective:    Physical Exam  Constitutional: She is oriented to person, place, and time. She appears well-developed and well-nourished. She is cooperative.  HENT:  Head: Normocephalic and atraumatic.    Eyes: EOM are normal.  Neck: Normal range of motion.  Cardiovascular: Normal rate, regular rhythm, normal heart sounds and intact distal pulses. Exam reveals no gallop and no friction rub.  No murmur heard. Pulmonary/Chest: Effort normal and breath sounds normal. No tachypnea. No respiratory distress. She has no decreased breath sounds. She has no wheezes. She has no rhonchi. She has no rales. She exhibits no tenderness.  Abdominal: Soft. Bowel sounds are normal.  Musculoskeletal: Normal range of motion. She exhibits no edema.  Neurological: She is alert and oriented to person, place, and time. Coordination normal.  Skin: Skin is warm, dry and intact.  Psychiatric: She has a normal mood and affect. Her behavior is normal. Judgment and thought content normal.  Nursing note and vitals reviewed.   BP (!) 160/98 (BP Location: Right Arm, Patient Position: Sitting, Cuff Size: Normal)   Pulse 90   Temp (!) 97.5 F (36.4 C) (Oral)   Resp 18   Ht 5\' 8"  (1.727 m)   Wt 227 lb 3.2 oz (103.1 kg)   SpO2 100%   BMI 34.55 kg/m  Wt Readings from Last 3 Encounters:  09/03/17 227 lb 3.2 oz (103.1 kg)  07/20/16 226 lb (102.5  kg)  06/05/16 232 lb 6.4 oz (105.4 kg)       Patient has been counseled extensively about nutrition and exercise as well as the importance of adherence with medications and regular follow-up. The patient was given clear instructions to go to ER or return to medical center if symptoms don't improve, worsen or new problems develop. The patient verbalized understanding.    Patient has been counseled on age-appropriate routine health concerns for screening and prevention. These are reviewed and up-to-date. Referrals have been placed accordingly. Immunizations are up-to-date or declined.      Follow-up: Return in about 3 weeks (around 09/24/2017) for BP recheck.   Vernia Buff  Raul Del, FNP-BC [facility] [place], [place]   09/03/2017, 12:01 PM

## 2017-09-04 ENCOUNTER — Telehealth: Payer: Self-pay

## 2017-09-04 LAB — CMP AND LIVER
ALT: 20 IU/L (ref 0–32)
AST: 25 IU/L (ref 0–40)
Albumin: 4.5 g/dL (ref 3.5–5.5)
Alkaline Phosphatase: 67 IU/L (ref 39–117)
BILIRUBIN, DIRECT: 0.22 mg/dL (ref 0.00–0.40)
BUN: 14 mg/dL (ref 6–24)
Bilirubin Total: 0.8 mg/dL (ref 0.0–1.2)
CO2: 23 mmol/L (ref 20–29)
Calcium: 9.9 mg/dL (ref 8.7–10.2)
Chloride: 98 mmol/L (ref 96–106)
Creatinine, Ser: 1.12 mg/dL — ABNORMAL HIGH (ref 0.57–1.00)
GFR calc Af Amer: 67 mL/min/{1.73_m2} (ref >59)
GFR, EST NON AFRICAN AMERICAN: 58 mL/min/{1.73_m2} — AB (ref >59)
GLUCOSE: 100 mg/dL — AB (ref 65–99)
Potassium: 3.8 mmol/L (ref 3.5–5.2)
Sodium: 138 mmol/L (ref 134–144)
Total Protein: 8 g/dL (ref 6.0–8.5)

## 2017-09-04 LAB — CBC
HEMOGLOBIN: 13.4 g/dL (ref 11.1–15.9)
Hematocrit: 40.8 % (ref 34.0–46.6)
MCH: 30.3 pg (ref 26.6–33.0)
MCHC: 32.8 g/dL (ref 31.5–35.7)
MCV: 92 fL (ref 79–97)
Platelets: 354 10*3/uL (ref 150–379)
RBC: 4.42 x10E6/uL (ref 3.77–5.28)
RDW: 13.8 % (ref 12.3–15.4)
WBC: 5.3 10*3/uL (ref 3.4–10.8)

## 2017-09-04 LAB — LIPID PANEL
Chol/HDL Ratio: 2.6 ratio (ref 0.0–4.4)
Cholesterol, Total: 207 mg/dL — ABNORMAL HIGH (ref 100–199)
HDL: 80 mg/dL (ref >39)
LDL CALC: 106 mg/dL — AB (ref 0–99)
Triglycerides: 104 mg/dL (ref 0–149)
VLDL CHOLESTEROL CAL: 21 mg/dL (ref 5–40)

## 2017-09-04 NOTE — Telephone Encounter (Signed)
-----   Message from [name], NP sent at 09/04/2017  8:38 AM EST ----- Please call patient: Tests show increased cholesterol/lipid levels.  Patient should continue to work on low fat, heart healthy diet and participate in regular aerobic exercise program to control as well. Also Cr+ which is blood work for kidney function is elevated. Please make sure you are drinking plenty of water (at least 64 oz a day). Will recheck labs at next office visit.

## 2017-09-04 NOTE — Telephone Encounter (Signed)
Patient is informed her lab result. Patient is aware to to start healthy diet and drink plenty of water.  Patient verified DOB.

## 2017-09-06 ENCOUNTER — Telehealth: Payer: Self-pay

## 2017-09-06 NOTE — Telephone Encounter (Signed)
Patient did not pick up. Left a voicemail for patient to call back.

## 2017-09-06 NOTE — Telephone Encounter (Signed)
-----   Message from [name], NP sent at 09/05/2017 11:59 PM EST ----- Mammogram is negative for any breast cancer at this time. Follow back up in one year.

## 2017-09-07 NOTE — Telephone Encounter (Signed)
!!!  Please inform patient mammogram is negative, recommending screening in one year!!!

## 2017-09-07 NOTE — Telephone Encounter (Signed)
-----   Message from [name], NP sent at 09/05/2017 11:59 PM EST ----- Mammogram is negative for any breast cancer at this time. Follow back up in one year.

## 2017-09-10 ENCOUNTER — Ambulatory Visit: Payer: No Typology Code available for payment source | Attending: Nurse Practitioner

## 2017-09-24 ENCOUNTER — Ambulatory Visit: Payer: Self-pay | Attending: Nurse Practitioner | Admitting: *Deleted

## 2017-09-24 VITALS — BP 174/78 | HR 79

## 2017-09-24 DIAGNOSIS — I10 Essential (primary) hypertension: Secondary | ICD-10-CM | POA: Insufficient documentation

## 2017-09-24 MED ORDER — CLONIDINE HCL 0.2 MG PO TABS
0.2000 mg | ORAL_TABLET | Freq: Once | ORAL | Status: AC
  Administered 2017-09-24: 0.2 mg via ORAL

## 2017-09-24 NOTE — Progress Notes (Signed)
Pt arrived to [facility], NAD,  alert and oriented, in good spirits.   Last OV  11//2018  PCP. Pt BP was 160/98.  Pt denies chest pain, SOB, HA, dizziness, or blurred vision.  Verified medication with patient. Pt states she did not take medication this morning. She has decreased salt intake in diet and has been more  Active.   Manual blood pressure reading:  170/100 manual  167/114 dinamap 170/110 manual   Pt PCP notified of BP reading.  Pt was administered Clonidine 0.2 mg per elevated blood pressure protocol. Blood pressure was rechecked. Nurse visit scheduled for 2 weeks for recheck blood pressure.

## 2017-10-08 ENCOUNTER — Encounter: Payer: Self-pay | Admitting: Pharmacist

## 2017-10-08 ENCOUNTER — Ambulatory Visit: Payer: Self-pay | Attending: Nurse Practitioner | Admitting: Pharmacist

## 2017-10-08 VITALS — BP 146/92 | HR 72

## 2017-10-08 DIAGNOSIS — Z79899 Other long term (current) drug therapy: Secondary | ICD-10-CM | POA: Insufficient documentation

## 2017-10-08 DIAGNOSIS — I10 Essential (primary) hypertension: Secondary | ICD-10-CM | POA: Insufficient documentation

## 2017-10-08 NOTE — Patient Instructions (Signed)
Thanks for coming to see Tammy Barajas  Pick up your medicine today  Schedule an appt with the nurse for blood pressure check in 3-4 weeks. We need to see your blood pressure on both of your medications

## 2017-10-08 NOTE — Progress Notes (Signed)
   S:    Patient arrives in good spirits.    Presents to the clinic for hypertension evaluation.    Patient denies adherence with medications. She did take the losartan but she does not have any more HCTZ.  Current BP Medications include:  Losartan 25 mg daily, HCTZ 25 mg daily  Dietary habits include: has cut back on salt, uses Ms. Dash, decreased alcohol intake.  O:   Last 3 Office BP readings: BP Readings from Last 3 Encounters:  10/08/17 (!) 146/92  09/24/17 (!) 174/78  09/03/17 (!) 160/98    BMET    Component Value Date/Time   NA 138 09/03/2017 1135   K 3.8 09/03/2017 1135   CL 98 09/03/2017 1135   CO2 23 09/03/2017 1135   GLUCOSE 100 (H) 09/03/2017 1135   GLUCOSE 92 05/10/2015 1319   BUN 14 09/03/2017 1135   CREATININE 1.12 (H) 09/03/2017 1135   CREATININE 1.07 05/10/2015 1319   CALCIUM 9.9 09/03/2017 1135   GFRNONAA 58 (L) 09/03/2017 1135   GFRNONAA 71 07/03/2014 1234   GFRAA 67 09/03/2017 1135   GFRAA 82 07/03/2014 1234    A/P: Hypertension longstanding currently uncontrolled on current medications due to running out of HCTZ. Patient to pick up today and restart. Recheck BP with nurse in 3-4 weeks. Encouraged her to keep up her exercise and decreased salt intake.   Results reviewed and written information provided.   Total time in face-to-face counseling 5 minutes.

## 2017-10-09 ENCOUNTER — Encounter (HOSPITAL_COMMUNITY): Payer: Self-pay

## 2017-10-30 ENCOUNTER — Ambulatory Visit: Payer: Self-pay | Attending: Nurse Practitioner | Admitting: *Deleted

## 2017-10-30 ENCOUNTER — Other Ambulatory Visit: Payer: Self-pay | Admitting: Nurse Practitioner

## 2017-10-30 VITALS — BP 174/44 | HR 88 | Temp 98.7°F | Ht 68.0 in | Wt 228.0 lb

## 2017-10-30 DIAGNOSIS — I10 Essential (primary) hypertension: Secondary | ICD-10-CM | POA: Insufficient documentation

## 2017-10-30 MED ORDER — LOSARTAN POTASSIUM 25 MG PO TABS: 25.0000 mg | ORAL_TABLET | Freq: Every day | ORAL | 1 refills | Status: DC

## 2017-10-30 MED ORDER — HYDROCHLOROTHIAZIDE 25 MG PO TABS: 25.0000 mg | ORAL_TABLET | Freq: Every day | ORAL | 3 refills | Status: DC

## 2017-10-30 MED ORDER — LOSARTAN POTASSIUM-HCTZ 100-25 MG PO TABS: 1.0000 | ORAL_TABLET | Freq: Every day | ORAL | 3 refills | Status: DC

## 2017-10-30 NOTE — Patient Instructions (Signed)
Please take only the new combination medication for your BP. Return for the nurse visit recheck.

## 2017-10-30 NOTE — Progress Notes (Signed)
Patient reports taking BP meds at 11:00 prior to 11:00 appointment.

## 2017-11-07 ENCOUNTER — Other Ambulatory Visit: Payer: Self-pay | Admitting: Pharmacist

## 2017-11-07 MED ORDER — HYDROCHLOROTHIAZIDE 25 MG PO TABS: 25.0000 mg | ORAL_TABLET | Freq: Every day | ORAL | 0 refills | Status: DC

## 2017-11-20 ENCOUNTER — Encounter: Payer: Self-pay | Admitting: Nurse Practitioner

## 2017-11-20 ENCOUNTER — Ambulatory Visit: Payer: Self-pay | Attending: Nurse Practitioner | Admitting: Nurse Practitioner

## 2017-11-20 VITALS — BP 184/130 | HR 70 | Temp 97.3°F | Ht 68.0 in | Wt 224.0 lb

## 2017-11-20 DIAGNOSIS — D170 Benign lipomatous neoplasm of skin and subcutaneous tissue of head, face and neck: Secondary | ICD-10-CM | POA: Insufficient documentation

## 2017-11-20 DIAGNOSIS — Z7982 Long term (current) use of aspirin: Secondary | ICD-10-CM | POA: Insufficient documentation

## 2017-11-20 DIAGNOSIS — Z8249 Family history of ischemic heart disease and other diseases of the circulatory system: Secondary | ICD-10-CM | POA: Insufficient documentation

## 2017-11-20 DIAGNOSIS — R51 Headache: Secondary | ICD-10-CM | POA: Insufficient documentation

## 2017-11-20 DIAGNOSIS — Z79899 Other long term (current) drug therapy: Secondary | ICD-10-CM | POA: Insufficient documentation

## 2017-11-20 DIAGNOSIS — I10 Essential (primary) hypertension: Secondary | ICD-10-CM | POA: Insufficient documentation

## 2017-11-20 MED ORDER — LOSARTAN POTASSIUM 25 MG PO TABS: 25.0000 mg | ORAL_TABLET | Freq: Every day | ORAL | 0 refills | Status: DC

## 2017-11-20 MED ORDER — CLONIDINE HCL 0.1 MG PO TABS
0.2000 mg | ORAL_TABLET | Freq: Once | ORAL | Status: AC
  Administered 2017-11-20: 0.2 mg via ORAL

## 2017-11-20 NOTE — Patient Instructions (Addendum)
DASH Eating Plan DASH stands for "Dietary Approaches to Stop Hypertension." The DASH eating plan is a healthy eating plan that has been shown to reduce high blood pressure (hypertension). It may also reduce your risk for type 2 diabetes, heart disease, and stroke. The DASH eating plan may also help with weight loss. What are tips for following this plan? General guidelines  Avoid eating more than 2,300 mg (milligrams) of salt (sodium) a day. If you have hypertension, you may need to reduce your sodium intake to 1,500 mg a day.  Limit alcohol intake to no more than 1 drink a day for nonpregnant women and 2 drinks a day for men. One drink equals 12 oz of beer, 5 oz of wine, or 1 oz of hard liquor.  Work with your health care provider to maintain a healthy body weight or to lose weight. Ask what an ideal weight is for you.  Get at least 30 minutes of exercise that causes your heart to beat faster (aerobic exercise) most days of the week. Activities may include walking, swimming, or biking.  Work with your health care provider or diet and nutrition specialist (dietitian) to adjust your eating plan to your individual calorie needs. Reading food labels  Check food labels for the amount of sodium per serving. Choose foods with less than 5 percent of the Daily Value of sodium. Generally, foods with less than 300 mg of sodium per serving fit into this eating plan.  To find whole grains, look for the word "whole" as the first word in the ingredient list. Shopping  Buy products labeled as "low-sodium" or "no salt added."  Buy fresh foods. Avoid canned foods and premade or frozen meals. Cooking  Avoid adding salt when cooking. Use salt-free seasonings or herbs instead of table salt or sea salt. Check with your health care provider or pharmacist before using salt substitutes.  Do not fry foods. Cook foods using healthy methods such as baking, boiling, grilling, and broiling instead.  Cook with  heart-healthy oils, such as olive, canola, soybean, or sunflower oil. Meal planning   Eat a balanced diet that includes: ? 5 or more servings of fruits and vegetables each day. At each meal, try to fill half of your plate with fruits and vegetables. ? Up to 6-8 servings of whole grains each day. ? Less than 6 oz of lean meat, poultry, or fish each day. A 3-oz serving of meat is about the same size as a deck of cards. One egg equals 1 oz. ? 2 servings of low-fat dairy each day. ? A serving of nuts, seeds, or beans 5 times each week. ? Heart-healthy fats. Healthy fats called Omega-3 fatty acids are found in foods such as flaxseeds and coldwater fish, like sardines, salmon, and mackerel.  Limit how much you eat of the following: ? Canned or prepackaged foods. ? Food that is high in trans fat, such as fried foods. ? Food that is high in saturated fat, such as fatty meat. ? Sweets, desserts, sugary drinks, and other foods with added sugar. ? Full-fat dairy products.  Do not salt foods before eating.  Try to eat at least 2 vegetarian meals each week.  Eat more home-cooked food and less restaurant, buffet, and fast food.  When eating at a restaurant, ask that your food be prepared with less salt or no salt, if possible. What foods are recommended? The items listed may not be a complete list. Talk with your dietitian about what   dietary choices are best for you. Grains Whole-grain or whole-wheat bread. Whole-grain or whole-wheat pasta. Brown rice. Oatmeal. Quinoa. Bulgur. Whole-grain and low-sodium cereals. Pita bread. Low-fat, low-sodium crackers. Whole-wheat flour tortillas. Vegetables Fresh or frozen vegetables (raw, steamed, roasted, or grilled). Low-sodium or reduced-sodium tomato and vegetable juice. Low-sodium or reduced-sodium tomato sauce and tomato paste. Low-sodium or reduced-sodium canned vegetables. Fruits All fresh, dried, or frozen fruit. Canned fruit in natural juice (without  added sugar). Meat and other protein foods Skinless chicken or turkey. Ground chicken or turkey. Pork with fat trimmed off. Fish and seafood. Egg whites. Dried beans, peas, or lentils. Unsalted nuts, nut butters, and seeds. Unsalted canned beans. Lean cuts of beef with fat trimmed off. Low-sodium, lean deli meat. Dairy Low-fat (1%) or fat-free (skim) milk. Fat-free, low-fat, or reduced-fat cheeses. Nonfat, low-sodium ricotta or cottage cheese. Low-fat or nonfat yogurt. Low-fat, low-sodium cheese. Fats and oils Soft margarine without trans fats. Vegetable oil. Low-fat, reduced-fat, or light mayonnaise and salad dressings (reduced-sodium). Canola, safflower, olive, soybean, and sunflower oils. Avocado. Seasoning and other foods Herbs. Spices. Seasoning mixes without salt. Unsalted popcorn and pretzels. Fat-free sweets. What foods are not recommended? The items listed may not be a complete list. Talk with your dietitian about what dietary choices are best for you. Grains Baked goods made with fat, such as croissants, muffins, or some breads. Dry pasta or rice meal packs. Vegetables Creamed or fried vegetables. Vegetables in a cheese sauce. Regular canned vegetables (not low-sodium or reduced-sodium). Regular canned tomato sauce and paste (not low-sodium or reduced-sodium). Regular tomato and vegetable juice (not low-sodium or reduced-sodium). Pickles. Olives. Fruits Canned fruit in a light or heavy syrup. Fried fruit. Fruit in cream or butter sauce. Meat and other protein foods Fatty cuts of meat. Ribs. Fried meat. Bacon. Sausage. Bologna and other processed lunch meats. Salami. Fatback. Hotdogs. Bratwurst. Salted nuts and seeds. Canned beans with added salt. Canned or smoked fish. Whole eggs or egg yolks. Chicken or turkey with skin. Dairy Whole or 2% milk, cream, and half-and-half. Whole or full-fat cream cheese. Whole-fat or sweetened yogurt. Full-fat cheese. Nondairy creamers. Whipped toppings.  Processed cheese and cheese spreads. Fats and oils Butter. Stick margarine. Lard. Shortening. Ghee. Bacon fat. Tropical oils, such as coconut, palm kernel, or palm oil. Seasoning and other foods Salted popcorn and pretzels. Onion salt, garlic salt, seasoned salt, table salt, and sea salt. Worcestershire sauce. Tartar sauce. Barbecue sauce. Teriyaki sauce. Soy sauce, including reduced-sodium. Steak sauce. Canned and packaged gravies. Fish sauce. Oyster sauce. Cocktail sauce. Horseradish that you find on the shelf. Ketchup. Mustard. Meat flavorings and tenderizers. Bouillon cubes. Hot sauce and Tabasco sauce. Premade or packaged marinades. Premade or packaged taco seasonings. Relishes. Regular salad dressings. Where to find more information:  National Heart, Lung, and Blood Institute: www.nhlbi.nih.gov  American Heart Association: www.heart.org Summary  The DASH eating plan is a healthy eating plan that has been shown to reduce high blood pressure (hypertension). It may also reduce your risk for type 2 diabetes, heart disease, and stroke.  With the DASH eating plan, you should limit salt (sodium) intake to 2,300 mg a day. If you have hypertension, you may need to reduce your sodium intake to 1,500 mg a day.  When on the DASH eating plan, aim to eat more fresh fruits and vegetables, whole grains, lean proteins, low-fat dairy, and heart-healthy fats.  Work with your health care provider or diet and nutrition specialist (dietitian) to adjust your eating plan to your individual   calorie needs. This information is not intended to replace advice given to you by your health care provider. Make sure you discuss any questions you have with your health care provider. Document Released: 09/28/2011 Document Revised: 10/02/2016 Document Reviewed: 10/02/2016 Elsevier Interactive Patient Education  2018 Elsevier Inc.  Hypertension Hypertension, commonly called high blood pressure, is when the force of blood  pumping through the arteries is too strong. The arteries are the blood vessels that carry blood from the heart throughout the body. Hypertension forces the heart to work harder to pump blood and may cause arteries to become narrow or stiff. Having untreated or uncontrolled hypertension can cause heart attacks, strokes, kidney disease, and other problems. A blood pressure reading consists of a higher number over a lower number. Ideally, your blood pressure should be below 120/80. The first ("top") number is called the systolic pressure. It is a measure of the pressure in your arteries as your heart beats. The second ("bottom") number is called the diastolic pressure. It is a measure of the pressure in your arteries as the heart relaxes. What are the causes? The cause of this condition is not known. What increases the risk? Some risk factors for high blood pressure are under your control. Others are not. Factors you can change  Smoking.  Having type 2 diabetes mellitus, high cholesterol, or both.  Not getting enough exercise or physical activity.  Being overweight.  Having too much fat, sugar, calories, or salt (sodium) in your diet.  Drinking too much alcohol. Factors that are difficult or impossible to change  Having chronic kidney disease.  Having a family history of high blood pressure.  Age. Risk increases with age.  Race. You may be at higher risk if you are African-American.  Gender. Men are at higher risk than women before age 45. After age 65, women are at higher risk than men.  Having obstructive sleep apnea.  Stress. What are the signs or symptoms? Extremely high blood pressure (hypertensive crisis) may cause:  Headache.  Anxiety.  Shortness of breath.  Nosebleed.  Nausea and vomiting.  Severe chest pain.  Jerky movements you cannot control (seizures).  How is this diagnosed? This condition is diagnosed by measuring your blood pressure while you are  seated, with your arm resting on a surface. The cuff of the blood pressure monitor will be placed directly against the skin of your upper arm at the level of your heart. It should be measured at least twice using the same arm. Certain conditions can cause a difference in blood pressure between your right and left arms. Certain factors can cause blood pressure readings to be lower or higher than normal (elevated) for a short period of time:  When your blood pressure is higher when you are in a health care provider's office than when you are at home, this is called white coat hypertension. Most people with this condition do not need medicines.  When your blood pressure is higher at home than when you are in a health care provider's office, this is called masked hypertension. Most people with this condition may need medicines to control blood pressure.  If you have a high blood pressure reading during one visit or you have normal blood pressure with other risk factors:  You may be asked to return on a different day to have your blood pressure checked again.  You may be asked to monitor your blood pressure at home for 1 week or longer.  If you are   diagnosed with hypertension, you may have other blood or imaging tests to help your health care provider understand your overall risk for other conditions. How is this treated? This condition is treated by making healthy lifestyle changes, such as eating healthy foods, exercising more, and reducing your alcohol intake. Your health care provider may prescribe medicine if lifestyle changes are not enough to get your blood pressure under control, and if:  Your systolic blood pressure is above 130.  Your diastolic blood pressure is above 80.  Your personal target blood pressure may vary depending on your medical conditions, your age, and other factors. Follow these instructions at home: Eating and drinking  Eat a diet that is high in fiber and potassium,  and low in sodium, added sugar, and fat. An example eating plan is called the DASH (Dietary Approaches to Stop Hypertension) diet. To eat this way: ? Eat plenty of fresh fruits and vegetables. Try to fill half of your plate at each meal with fruits and vegetables. ? Eat whole grains, such as whole wheat pasta, brown rice, or whole grain bread. Fill about one quarter of your plate with whole grains. ? Eat or drink low-fat dairy products, such as skim milk or low-fat yogurt. ? Avoid fatty cuts of meat, processed or cured meats, and poultry with skin. Fill about one quarter of your plate with lean proteins, such as fish, chicken without skin, beans, eggs, and tofu. ? Avoid premade and processed foods. These tend to be higher in sodium, added sugar, and fat.  Reduce your daily sodium intake. Most people with hypertension should eat less than 1,500 mg of sodium a day.  Limit alcohol intake to no more than 1 drink a day for nonpregnant women and 2 drinks a day for men. One drink equals 12 oz of beer, 5 oz of wine, or 1 oz of hard liquor. Lifestyle  Work with your health care provider to maintain a healthy body weight or to lose weight. Ask what an ideal weight is for you.  Get at least 30 minutes of exercise that causes your heart to beat faster (aerobic exercise) most days of the week. Activities may include walking, swimming, or biking.  Include exercise to strengthen your muscles (resistance exercise), such as pilates or lifting weights, as part of your weekly exercise routine. Try to do these types of exercises for 30 minutes at least 3 days a week.  Do not use any products that contain nicotine or tobacco, such as cigarettes and e-cigarettes. If you need help quitting, ask your health care provider.  Monitor your blood pressure at home as told by your health care provider.  Keep all follow-up visits as told by your health care provider. This is important. Medicines  Take over-the-counter and  prescription medicines only as told by your health care provider. Follow directions carefully. Blood pressure medicines must be taken as prescribed.  Do not skip doses of blood pressure medicine. Doing this puts you at risk for problems and can make the medicine less effective.  Ask your health care provider about side effects or reactions to medicines that you should watch for. Contact a health care provider if:  You think you are having a reaction to a medicine you are taking.  You have headaches that keep coming back (recurring).  You feel dizzy.  You have swelling in your ankles.  You have trouble with your vision. Get help right away if:  You develop a severe headache or confusion.    You have unusual weakness or numbness.  You feel faint.  You have severe pain in your chest or abdomen.  You vomit repeatedly.  You have trouble breathing. Summary  Hypertension is when the force of blood pumping through your arteries is too strong. If this condition is not controlled, it may put you at risk for serious complications.  Your personal target blood pressure may vary depending on your medical conditions, your age, and other factors. For most people, a normal blood pressure is less than 120/80.  Hypertension is treated with lifestyle changes, medicines, or a combination of both. Lifestyle changes include weight loss, eating a healthy, low-sodium diet, exercising more, and limiting alcohol. This information is not intended to replace advice given to you by your health care provider. Make sure you discuss any questions you have with your health care provider. Document Released: 10/09/2005 Document Revised: 09/06/2016 Document Reviewed: 09/06/2016 Elsevier Interactive Patient Education  2018 Elsevier Inc.  

## 2017-11-20 NOTE — Progress Notes (Signed)
Assessment & Plan:  Tammy Barajas was seen today for hypertension.  Diagnoses and all orders for this visit:  Essential hypertension -     cloNIDine (CATAPRES) tablet 0.2 mg -     losartan (COZAAR) 25 MG tablet; Take 1 tablet (25 mg total) by mouth daily. Continue all antihypertensives as prescribed.  Remember to bring in your blood pressure log with you for your follow up appointment.  DASH/Mediterranean Diets are healthier choices for HTN.     Lipoma of face -     Ambulatory referral to Plastic Surgery    Patient has been counseled on age-appropriate routine health concerns for screening and prevention. These are reviewed and up-to-date. Referrals have been placed accordingly. Immunizations are up-to-date or declined.    Subjective:   Chief Complaint  Patient presents with  . Hypertension    Patient is here for blood pressure check. Patient need medication refills for Losartan.    HPI Tammy Barajas 52 y.o. female presents to office today for blood pressure recheck.   Essential Hypertension She had a nurse visit for BP check on 10-30-2017. Her blood pressure was elevated at that time and she was instructed to start losartan/hctz 100-25 at that time and to follow up in 2 weeks. Today she states she did not pick up the prescription for the Losartan-HCTZ because "that's too much medicine and I don't want my hair to fall out". She also states she ran out of the losartan 25mg  that she continued to take despite instructions to pick up the new medication. She is adamant that she does not want to start on the new dose and that her blood pressure has been controlled up until a few days when she ran out of her losartan. She requests a 30 day refill of the losartan 25mg  to take with the HCTZ 25mg  and follow up in 3 weeks (she will be out of town in 2 weeks). She is agreeable to increase the Losartan if her blood pressure remains poorly controlled at her next office visit. She is endorsing a mild  headache today. Denies chest pain, shortness of breath, palpitations, lightheadedness, dizziness or BLE edema. She continues to drink alcohol.     Review of Systems  Constitutional: Negative for fever, malaise/fatigue and weight loss.  HENT: Negative.  Negative for nosebleeds.   Eyes: Negative.  Negative for blurred vision and double vision.  Respiratory: Negative.  Negative for cough and shortness of breath.   Cardiovascular: Negative.  Negative for chest pain, palpitations and leg swelling.  Musculoskeletal: Negative.  Negative for back pain and neck pain.  Neurological: Positive for headaches. Negative for dizziness, focal weakness and seizures.  Endo/Heme/Allergies: Negative for environmental allergies.  Psychiatric/Behavioral: Negative.  Negative for suicidal ideas.    Past Medical History:  Diagnosis Date  . Hypertension     History reviewed. No pertinent surgical history.  Family History  Problem Relation Age of Onset  . Hypertension Mother   . Hypertension Brother     Social History Reviewed with no changes to be made today.   Outpatient Medications Prior to Visit  Medication Sig Dispense Refill  . aspirin 325 MG tablet Take 325 mg by mouth daily.    . hydrochlorothiazide (HYDRODIURIL) 25 MG tablet Take 1 tablet (25 mg total) by mouth daily. 90 tablet 0  . Multiple Vitamin (MULTIVITAMIN) tablet Take 1 tablet by mouth daily.    Marland Kitchen losartan (COZAAR) 25 MG tablet Take 1 tablet (25 mg total) by mouth  daily. 30 tablet 1   No facility-administered medications prior to visit.     No Known Allergies     Objective:    BP (!) 184/130 (BP Location: Left Arm, Cuff Size: Normal)   Pulse 70   Temp (!) 97.3 F (36.3 C) (Oral)   Ht 5\' 8"  (1.727 m)   Wt 224 lb (101.6 kg)   SpO2 97%   BMI 34.06 kg/m  Wt Readings from Last 3 Encounters:  11/20/17 224 lb (101.6 kg)  10/30/17 228 lb (103.4 kg)  09/03/17 227 lb 3.2 oz (103.1 kg)    Physical Exam  Constitutional: She is  oriented to person, place, and time. She appears well-developed and well-nourished. She is cooperative.  HENT:  Head: Normocephalic and atraumatic.    Eyes: EOM are normal.  Neck: Normal range of motion.  Cardiovascular: Normal rate, regular rhythm, normal heart sounds and intact distal pulses. Exam reveals no gallop and no friction rub.  No murmur heard. Pulmonary/Chest: Effort normal and breath sounds normal. No tachypnea. No respiratory distress. She has no decreased breath sounds. She has no wheezes. She has no rhonchi. She has no rales. She exhibits no tenderness.  Abdominal: Soft. Bowel sounds are normal.  Musculoskeletal: Normal range of motion. She exhibits no edema.  Neurological: She is alert and oriented to person, place, and time. Coordination normal.  Skin: Skin is warm and dry.  Psychiatric: She has a normal mood and affect. Her speech is normal. Judgment and thought content normal. She is hyperactive.  Nursing note and vitals reviewed.      Patient has been counseled extensively about nutrition and exercise as well as the importance of adherence with medications and regular follow-up. The patient was given clear instructions to go to ER or return to medical center if symptoms don't improve, worsen or new problems develop. The patient verbalized understanding.   Follow-up: Return in about 3 weeks (around 12/11/2017) for BP recheck.   [name], FNP-BC [facility] and [place], [place]   11/20/2017, 5:49 PM

## 2017-12-13 ENCOUNTER — Ambulatory Visit: Payer: Self-pay | Attending: Nurse Practitioner | Admitting: *Deleted

## 2017-12-13 VITALS — BP 157/110 | HR 89

## 2017-12-13 DIAGNOSIS — I10 Essential (primary) hypertension: Secondary | ICD-10-CM | POA: Insufficient documentation

## 2017-12-13 NOTE — Progress Notes (Signed)
Pt arrived to [facility], alert and oriented and in good spirits. Last OV on 11/20/2017, pt BP was 184/130.  BP readings at previous BP checks were documented: 174/44 146/92 174/78  Pt denies chest pain, SOB, HA, dizziness, or blurred vision.  Verified medication with patient.  Pt states medication was taken today.  Blood pressure readings were: 156/108 157/110  Pt advised per last OV note, the dose of Losartan would need to be increased. She requested against the recommendation stated by PCP.  Advised patient that nurse visit would be routed to PCP to advise on dosage of medication. Education provided on not taking recommended dose.

## 2017-12-27 ENCOUNTER — Telehealth: Payer: Self-pay | Admitting: Pharmacist

## 2017-12-27 NOTE — Telephone Encounter (Signed)
Received fax from [facility] that patient was impacted by losartan recall and they need to change the losartan 25 mg to something else. After review of recent notes, patient was supposed to change medications anyway due to elevated blood pressure but was refusing. Will forward to PCP

## 2017-12-31 ENCOUNTER — Other Ambulatory Visit: Payer: Self-pay | Admitting: Nurse Practitioner

## 2017-12-31 MED ORDER — LISINOPRIL-HYDROCHLOROTHIAZIDE 20-25 MG PO TABS: 1.0000 | ORAL_TABLET | Freq: Every day | ORAL | 3 refills | Status: DC

## 2017-12-31 NOTE — Telephone Encounter (Signed)
Switched to prinzide. Please call patient and let her know I have sent a new prescription for her Hypertension. She should discontinue her HCTZ

## 2018-01-01 NOTE — Telephone Encounter (Signed)
CMA spoke to patient to inform her new Rx for her hypertension was sent. Patient stated she do not want to take Lisinopril-hydrochlorothiazide due to too strong and makes her head hurt. Pt's would like to be back on Norvasc and would like to be seen by PCP if she can.

## 2018-01-06 ENCOUNTER — Other Ambulatory Visit: Payer: Self-pay | Admitting: Nurse Practitioner

## 2018-01-06 MED ORDER — AMLODIPINE BESYLATE 10 MG PO TABS: 10.0000 mg | ORAL_TABLET | Freq: Every day | ORAL | 0 refills | Status: DC

## 2018-01-06 NOTE — Telephone Encounter (Signed)
I have started her back on amlodipine 10mg . She needs to make an office appointment within the next 2 weeks

## 2018-01-07 ENCOUNTER — Telehealth: Payer: Self-pay | Admitting: Nurse Practitioner

## 2018-01-07 MED ORDER — AMLODIPINE BESYLATE 10 MG PO TABS
10.0000 mg | ORAL_TABLET | Freq: Every day | ORAL | 0 refills | Status: DC
Start: 2018-01-07 — End: 2018-02-19

## 2018-01-07 NOTE — Telephone Encounter (Signed)
Patient stated she needs her amLODipine (NORVASC) 10 MG tablet [[id number]]  Patient requested for medication to be sent to [place], [place], [place] [address]  [phone]  Please notify pt once ready , patient said she has been without bp meds for 2wks.

## 2018-01-07 NOTE — Telephone Encounter (Signed)
Rx sent to [address] per Patient request.   CMA called patient to inform Rx has been sent.

## 2018-01-07 NOTE — Telephone Encounter (Signed)
CMA attempt to call patient to inform her Rx Amlodipine 10mg  had been sent to [facility] pharmacy. PCP would like her to make Office appt. Within the next 2 weeks. Left a voicemail for patient above.  It patient call back, please inform above.

## 2018-01-07 NOTE — Telephone Encounter (Signed)
noted 

## 2018-01-07 NOTE — Telephone Encounter (Signed)
See attached message

## 2018-01-15 ENCOUNTER — Telehealth: Payer: Self-pay | Admitting: Nurse Practitioner

## 2018-01-15 NOTE — Telephone Encounter (Signed)
Pt called to say her new BP medication is giving her Cramps all over her body,she states she has discontinued use until advised by her provider. Please follow up [phone]

## 2018-01-15 NOTE — Telephone Encounter (Signed)
Will route to PCP 

## 2018-01-17 NOTE — Telephone Encounter (Signed)
CMA called patient to inform her to make an office appointment.  Patient understood and will make one.

## 2018-01-17 NOTE — Telephone Encounter (Signed)
She needs to make an office appointment. She requested that I restart her on the medication that she now reports she can not tolerate.

## 2018-01-22 ENCOUNTER — Telehealth: Payer: Self-pay | Admitting: Nurse Practitioner

## 2018-01-22 NOTE — Telephone Encounter (Signed)
Patient called and stated that she needed an urgent appointment to manage her medications. Patient was informed schedule is booked for the month of April. Patient verbalized understanding and stated she needed a soon appointment and wondered if her provider could squeeze her in. Patient was asked if she would be willing to see a different provider, patient stated she wanted to stay with her provider. Patient was placed on waiting list for an appointment. Patient requested for a call to inform her on the update.

## 2018-01-23 NOTE — Telephone Encounter (Signed)
CMA attempt to call patient. No answer and left a VM for patient to call back if she have any concerns.  If patient call back, please inform:  Per PCP, Patient will have to call the receptionist to see if there are any opening.  If she need to soon as possible, she can be seen by [facility] appt.

## 2018-01-23 NOTE — Telephone Encounter (Signed)
Please call patient back and inform patient that there are no opening for [facility] right now. Patient will have to call Receptionist for update for scheduling.  If she need to be seen as possible, PCP suggest Walk-In appt.

## 2018-02-15 ENCOUNTER — Telehealth: Payer: Self-pay

## 2018-02-15 NOTE — Telephone Encounter (Signed)
Fax from [facility] request to refill patient Hydrochlorothiazide 25mg   Quantity:90  Last fill was 11/07/2017  Last OV with Raul Del: 09/03/2017 Last Nurse visit: 12/13/2017  Okay to refill?

## 2018-02-18 ENCOUNTER — Telehealth: Payer: Self-pay | Admitting: Nurse Practitioner

## 2018-02-18 NOTE — Telephone Encounter (Signed)
Will await patient's office visit tomorrow

## 2018-02-18 NOTE — Telephone Encounter (Signed)
Patient called and requested for listed medications to be refilled and sent to the [facility] on [address].  amLODipine (NORVASC) 10 MG tablet [[id number]] hydrochlorothiazide (HYDRODIURIL) 25 MG tablet; Take 1 tablet (25 mg total) daily by mouth.

## 2018-02-19 ENCOUNTER — Encounter: Payer: Self-pay | Admitting: Nurse Practitioner

## 2018-02-19 ENCOUNTER — Ambulatory Visit: Payer: Self-pay | Attending: Nurse Practitioner | Admitting: Nurse Practitioner

## 2018-02-19 VITALS — BP 159/97 | HR 83 | Temp 98.3°F | Ht 67.0 in | Wt 224.4 lb

## 2018-02-19 DIAGNOSIS — E669 Obesity, unspecified: Secondary | ICD-10-CM | POA: Insufficient documentation

## 2018-02-19 DIAGNOSIS — I10 Essential (primary) hypertension: Secondary | ICD-10-CM | POA: Insufficient documentation

## 2018-02-19 DIAGNOSIS — Z6835 Body mass index (BMI) 35.0-35.9, adult: Secondary | ICD-10-CM | POA: Insufficient documentation

## 2018-02-19 DIAGNOSIS — Z760 Encounter for issue of repeat prescription: Secondary | ICD-10-CM | POA: Insufficient documentation

## 2018-02-19 DIAGNOSIS — Z7982 Long term (current) use of aspirin: Secondary | ICD-10-CM | POA: Insufficient documentation

## 2018-02-19 DIAGNOSIS — Z79899 Other long term (current) drug therapy: Secondary | ICD-10-CM | POA: Insufficient documentation

## 2018-02-19 MED ORDER — HYDROCHLOROTHIAZIDE 25 MG PO TABS: 25.0000 mg | ORAL_TABLET | Freq: Every day | ORAL | 3 refills | Status: DC

## 2018-02-19 NOTE — Telephone Encounter (Signed)
Patient came in for an OV today.  PCP addressed it.

## 2018-02-19 NOTE — Progress Notes (Signed)
Assessment & Plan:  Tammy Barajas was seen today for medication refill.  Diagnoses and all orders for this visit:  Essential hypertension -     hydrochlorothiazide (HYDRODIURIL) 25 MG tablet; Take 1 tablet (25 mg total) by mouth daily. -     CMP14+EGFR -     Cancel: CBC -     Lipid panel Continue all antihypertensives as prescribed.  Remember to bring in your blood pressure log with you for your follow up appointment.  DASH/Mediterranean Diets are healthier choices for HTN.    Obesity Discussed diet and exercise for person with BMI >25. Instructed: You must burn more calories than you eat. Losing 5 percent of your body weight should be considered a success. In the longer term, losing more than 15 percent of your body weight and staying at this weight is an extremely good result. However, keep in mind that even losing 5 percent of your body weight leads to important health benefits, so try not to get discouraged if you're not able to lose more than this. Will recheck weight in 3-6 months.  Patient has been counseled on age-appropriate routine health concerns for screening and prevention. These are reviewed and up-to-date. Referrals have been placed accordingly. Immunizations are up-to-date or declined.    Subjective:   Chief Complaint  Patient presents with  . Medication Refill    Tammy Barajas.stated she need Hydrochorothizide refills.    HPI Tammy Barajas 52 y.o. female presents to office today to follow up for hypertension.   Essential Hypertension She has tried multiple antihypertensives: hctz,  losartan, amlodipine, hyzaar and endorses intolerance of all. She is requesting to retry hctz again. I have instructed her if her blood pressure is not under control with HCTZ 46m daily alone we will still need to add another agent. She is very reluctant to take more than one antihypertensive. We discussed her increased risk for stroke, kidney failure or heart attack with poor compliance. Denies  chest pain, shortness of breath, palpitations, lightheadedness, dizziness or BLE edema.  BP Readings from Last 3 Encounters:  02/19/18 (!) 159/97  12/13/17 (!) 157/110  11/20/17 (!) 184/130      Review of Systems  Constitutional: Negative for fever, malaise/fatigue and weight loss.  HENT: Negative.  Negative for nosebleeds.   Eyes: Negative.  Negative for blurred vision, double vision and photophobia.  Respiratory: Negative.  Negative for cough and shortness of breath.   Cardiovascular: Negative.  Negative for chest pain, palpitations and leg swelling.  Gastrointestinal: Negative.  Negative for heartburn, nausea and vomiting.  Musculoskeletal: Negative.  Negative for myalgias.  Neurological: Positive for headaches. Negative for dizziness, focal weakness and seizures.  Psychiatric/Behavioral: Negative.  Negative for suicidal ideas.    Past Medical History:  Diagnosis Date  . Hypertension     History reviewed. No pertinent surgical history.  Family History  Problem Relation Age of Onset  . Hypertension Mother   . Hypertension Brother     Social History Reviewed with no changes to be made today.   Outpatient Medications Prior to Visit  Medication Sig Dispense Refill  . aspirin 325 MG tablet Take 325 mg by mouth daily.    . Multiple Vitamin (MULTIVITAMIN) tablet Take 1 tablet by mouth daily.    .Marland KitchenamLODipine (NORVASC) 10 MG tablet Take 1 tablet (10 mg total) by mouth daily. (Patient not taking: Reported on 02/19/2018) 90 tablet 0   No facility-administered medications prior to visit.     No Known Allergies  Objective:    BP (!) 159/97 (BP Location: Right Arm, Cuff Size: Large)   Pulse 83   Temp 98.3 F (36.8 C) (Oral)   Ht _0  (1.702 m)   Wt 224 lb 6.4 oz (101.8 kg)   SpO2 95%   BMI 35.15 kg/m  Wt Readings from Last 3 Encounters:  02/19/18 224 lb 6.4 oz (101.8 kg)  11/20/17 224 lb (101.6 kg)  10/30/17 228 lb (103.4 kg)    Physical Exam  Constitutional:  She is oriented to person, place, and time. She appears well-developed and well-nourished. She is cooperative.  HENT:  Head: Normocephalic and atraumatic.  Eyes: EOM are normal.  Neck: Normal range of motion.  Cardiovascular: Normal rate, regular rhythm and normal heart sounds. Exam reveals no gallop and no friction rub.  No murmur heard. Pulmonary/Chest: Effort normal and breath sounds normal. No tachypnea. No respiratory distress. She has no decreased breath sounds. She has no wheezes. She has no rhonchi. She has no rales. She exhibits no tenderness.  Abdominal: Soft. Bowel sounds are normal.  Musculoskeletal: Normal range of motion. She exhibits no edema.  Neurological: She is alert and oriented to person, place, and time. Coordination normal.  Skin: Skin is warm and dry.  Psychiatric: She has a normal mood and affect. Her behavior is normal. Judgment and thought content normal.  Nursing note and vitals reviewed.      Patient has been counseled extensively about nutrition and exercise as well as the importance of adherence with medications and regular follow-up. The patient was given clear instructions to go to ER or return to medical center if symptoms don't improve, worsen or new problems develop. The patient verbalized understanding.   Follow-up: Return in about 3 weeks (around 03/14/2018) for BP recheck; [facility] .   [name], FNP-BC [facility] and [place], [place]   02/19/2018, 4:08 PM

## 2018-02-20 LAB — LIPID PANEL
CHOLESTEROL TOTAL: 179 mg/dL (ref 100–199)
Chol/HDL Ratio: 2.4 ratio (ref 0.0–4.4)
HDL: 74 mg/dL (ref >39)
LDL Calculated: 86 mg/dL (ref 0–99)
Triglycerides: 94 mg/dL (ref 0–149)
VLDL Cholesterol Cal: 19 mg/dL (ref 5–40)

## 2018-02-20 LAB — CMP14+EGFR
ALBUMIN: 4.1 g/dL (ref 3.5–5.5)
ALK PHOS: 51 IU/L (ref 39–117)
ALT: 14 IU/L (ref 0–32)
AST: 17 IU/L (ref 0–40)
Albumin/Globulin Ratio: 1.2 (ref 1.2–2.2)
BILIRUBIN TOTAL: 0.4 mg/dL (ref 0.0–1.2)
BUN / CREAT RATIO: 15 (ref 9–23)
BUN: 15 mg/dL (ref 6–24)
CO2: 20 mmol/L (ref 20–29)
Calcium: 9.1 mg/dL (ref 8.7–10.2)
Chloride: 104 mmol/L (ref 96–106)
Creatinine, Ser: 1 mg/dL (ref 0.57–1.00)
GFR calc non Af Amer: 66 mL/min/{1.73_m2} (ref >59)
GFR, EST AFRICAN AMERICAN: 76 mL/min/{1.73_m2} (ref >59)
GLOBULIN, TOTAL: 3.3 g/dL (ref 1.5–4.5)
Glucose: 98 mg/dL (ref 65–99)
Potassium: 4.5 mmol/L (ref 3.5–5.2)
SODIUM: 138 mmol/L (ref 134–144)
TOTAL PROTEIN: 7.4 g/dL (ref 6.0–8.5)

## 2018-02-22 ENCOUNTER — Telehealth: Payer: Self-pay

## 2018-02-22 NOTE — Telephone Encounter (Signed)
CMA attempt to call patient to inform on lab results. No answer and left a VM for patient.  If patient call back, please inform:  Labs are all normal. Make sure you are drinking at least 48 oz of water per day. Work on eating a low fat, heart healthy diet and participate in regular aerobic exercise program to control as well. Exercise at least 150 minutes per week.

## 2018-02-22 NOTE — Telephone Encounter (Signed)
-----   Message from [name], NP sent at 02/20/2018  9:36 PM EDT ----- Labs are all normal. Make sure you are drinking at least 48 oz of water per day. Work on eating a low fat, heart healthy diet and participate in regular aerobic exercise program to control as well. Exercise at least 150 minutes per week.

## 2018-03-14 ENCOUNTER — Encounter (INDEPENDENT_AMBULATORY_CARE_PROVIDER_SITE_OTHER): Payer: Self-pay | Admitting: Nurse Practitioner

## 2018-03-14 ENCOUNTER — Telehealth (INDEPENDENT_AMBULATORY_CARE_PROVIDER_SITE_OTHER): Payer: Self-pay | Admitting: Nurse Practitioner

## 2018-03-14 ENCOUNTER — Other Ambulatory Visit: Payer: Self-pay | Admitting: Nurse Practitioner

## 2018-03-14 MED ORDER — CLONIDINE HCL 0.1 MG PO TABS: 0.1000 mg | ORAL_TABLET | Freq: Two times a day (BID) | ORAL | 11 refills | Status: DC

## 2018-03-14 NOTE — Telephone Encounter (Signed)
Sodium levels were normal in April. I am sending clonidine to the pharmacy for her to take. She will need to follow up in 2-3 weeks.

## 2018-03-14 NOTE — Telephone Encounter (Signed)
Patient would like her PCP to give her a call in regards to her BP medications, she needs her medications not to be large in size and they need to be light, her sodium levels have been high due to her recent diet and would like advise before placing any future appointments. Please Arnoldo Hooker

## 2018-03-15 ENCOUNTER — Encounter

## 2018-03-18 NOTE — Progress Notes (Signed)
This encounter was created in error - please disregard.

## 2018-08-15 ENCOUNTER — Other Ambulatory Visit: Payer: Self-pay

## 2018-08-15 ENCOUNTER — Emergency Department (HOSPITAL_COMMUNITY)
Admission: EM | Admit: 2018-08-15 | Discharge: 2018-08-15 | Disposition: A | Discharge status: Home / Self Care | Payer: Self-pay | Attending: Emergency Medicine | Admitting: Emergency Medicine

## 2018-08-15 ENCOUNTER — Encounter (HOSPITAL_COMMUNITY): Payer: Self-pay

## 2018-08-15 DIAGNOSIS — M25561 Pain in right knee: Secondary | ICD-10-CM | POA: Insufficient documentation

## 2018-08-15 DIAGNOSIS — Z79899 Other long term (current) drug therapy: Secondary | ICD-10-CM | POA: Insufficient documentation

## 2018-08-15 DIAGNOSIS — Z7982 Long term (current) use of aspirin: Secondary | ICD-10-CM | POA: Insufficient documentation

## 2018-08-15 DIAGNOSIS — M25562 Pain in left knee: Secondary | ICD-10-CM | POA: Insufficient documentation

## 2018-08-15 DIAGNOSIS — I10 Essential (primary) hypertension: Secondary | ICD-10-CM | POA: Insufficient documentation
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
## 2018-08-15 NOTE — Progress Notes (Signed)
Orthopedic Tech Progress Note Patient Details:  Tammy Barajas 1965-12-22 [id number] Knee sleeve was given in hand to go with to put it on. Pt pants was too tight on her for sleeve to fit on it. Patient ID: Tammy Barajas, female   DOB: 06-12-1966, 52 y.o.   MRN: [id number]   Ladell Pier [facility] 08/15/2018, 8:20 PM
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
## 2018-08-15 NOTE — Discharge Instructions (Addendum)
Wear knee sleeve as needed for pain and swelling Take Ibuprofen or Tylenol for pain Please follow up with Dr. Nicki Reaper if you continue to have problems with your knees

## 2018-11-08 ENCOUNTER — Telehealth (HOSPITAL_COMMUNITY): Payer: Self-pay | Admitting: *Deleted

## 2018-11-08 NOTE — Telephone Encounter (Signed)
Telephoned patient at home number and left message to return call to [facility] 

## 2019-03-24 ENCOUNTER — Other Ambulatory Visit: Payer: Self-pay | Admitting: Nurse Practitioner

## 2019-03-24 NOTE — Telephone Encounter (Signed)
New Message  1) Medication(s) Requested (by name): hydrochlorothiazide (HYDRODIURIL) 25 MG tablet  2) Pharmacy of Choice: [facility] on [address]  3) Special Requests: Pt has 2 pills left and would like to have a prescription until her appt on 04/01/2019   Approved medications will be sent to the pharmacy, we will reach out if there is an issue.  Requests made after 3pm may not be addressed until the following business day!  If a patient is unsure of the name of the medication(s) please note and ask patient to call back when they are able to provide all info, do not send to responsible party until all information is available!

## 2019-03-26 ENCOUNTER — Other Ambulatory Visit: Payer: Self-pay | Admitting: Nurse Practitioner

## 2019-04-01 ENCOUNTER — Ambulatory Visit: Payer: Self-pay | Attending: Nurse Practitioner | Admitting: Nurse Practitioner

## 2019-04-01 ENCOUNTER — Other Ambulatory Visit: Payer: Self-pay

## 2019-04-01 ENCOUNTER — Encounter (INDEPENDENT_AMBULATORY_CARE_PROVIDER_SITE_OTHER): Payer: Self-pay

## 2019-04-01 ENCOUNTER — Encounter: Payer: Self-pay | Admitting: Nurse Practitioner

## 2019-04-01 VITALS — BP 161/91 | HR 95 | Temp 98.2°F | Ht 67.0 in | Wt 235.4 lb

## 2019-04-01 DIAGNOSIS — Z1211 Encounter for screening for malignant neoplasm of colon: Secondary | ICD-10-CM

## 2019-04-01 DIAGNOSIS — I10 Essential (primary) hypertension: Secondary | ICD-10-CM

## 2019-04-01 DIAGNOSIS — R7303 Prediabetes: Secondary | ICD-10-CM

## 2019-04-01 NOTE — Progress Notes (Addendum)
Assessment & Plan:  Tammy Barajas was seen today for medication refill.  Diagnoses and all orders for this visit:  Essential hypertension -     CMP14+EGFR -     Lipid panel Continue all antihypertensives as prescribed.  Remember to bring in your blood pressure log with you for your follow up appointment.  DASH/Mediterranean Diets are healthier choices for HTN.    Prediabetes -     Hemoglobin A1c Controlled Continue medications as prescribed.  Continue blood sugar control as discussed in office today, low carbohydrate diet, and regular physical exercise as tolerated, 150 minutes per week (30 min each day, 5 days per week, or 50 min 3 days per week).    Colon cancer screening -     Fecal occult blood, imunochemical([facility])    Patient has been counseled on age-appropriate routine health concerns for screening and prevention. These are reviewed and up-to-date. Referrals have been placed accordingly. Immunizations are up-to-date or declined.    Subjective:   Chief Complaint  Patient presents with  . Medication Refill    Pt is asking for medication refills.    HPI Tammy Barajas 53 y.o. female presents to office today for follow up to HTN. She has a history of medication noncompliance.   Essential Hypertension Poorly controlled. She adamantly refuses to take any antihypertensives aside from HCTZ 50m which is clearly not helping to control her HTN. She has been prescribed clonidine, norvasc, Losartan (states I'm not taking anything that can cause cancer), hyzaar and prinzide. Stopped taking them all due to various side effects and she did not like the way they made her feel. I have explained to her numerous times that she is at great risk for stroke or heart attack. She never took the clonidine that was prescribed recently. She states she will take vinegar at home to help lower her blood pressure. Denies chest pain, shortness of breath, palpitations, lightheadedness,  dizziness, headaches or BLE edema.  BP Readings from Last 3 Encounters:  04/01/19 (!) 161/91  08/15/18 (!) 183/122  02/19/18 (!) 159/97    Hyperlipidemia Lipid levels are elevated. Will start on atorvastatin. Very unlikely that she will be compliant.  Lab Results  Component Value Date   CHOL 215 (H) 04/01/2019   Lab Results  Component Value Date   HDL 60 04/01/2019   Lab Results  Component Value Date   LDLCALC 120 (H) 04/01/2019   Lab Results  Component Value Date   TRIG 177 (H) 04/01/2019   Lab Results  Component Value Date   CHOLHDL 3.6 04/01/2019   Review of Systems  Constitutional: Negative for fever, malaise/fatigue and weight loss.  HENT: Negative.  Negative for nosebleeds.   Eyes: Negative.  Negative for blurred vision, double vision and photophobia.  Respiratory: Negative.  Negative for cough and shortness of breath.   Cardiovascular: Negative.  Negative for chest pain, palpitations and leg swelling.  Gastrointestinal: Negative.  Negative for heartburn, nausea and vomiting.  Musculoskeletal: Negative.  Negative for myalgias.  Neurological: Negative.  Negative for dizziness, focal weakness, seizures and headaches.  Psychiatric/Behavioral: Negative.  Negative for suicidal ideas.    Past Medical History:  Diagnosis Date  . Hypertension     History reviewed. No pertinent surgical history.  Family History  Problem Relation Age of Onset  . Hypertension Mother   . Hypertension Brother     Social History Reviewed with no changes to be made today.   Outpatient Medications Prior to Visit  Medication Sig  Dispense Refill  . cloNIDine (CATAPRES) 0.1 MG tablet Take 1 tablet (0.1 mg total) by mouth 2 (two) times daily. 60 tablet 11  . hydrochlorothiazide (HYDRODIURIL) 25 MG tablet Take 1 tablet by mouth once daily 30 tablet 0  . Multiple Vitamin (MULTIVITAMIN) tablet Take 1 tablet by mouth daily.    Marland Kitchen aspirin 325 MG tablet Take 325 mg by mouth daily.     No  facility-administered medications prior to visit.     No Known Allergies     Objective:    BP (!) 161/91 (BP Location: Right Arm, Patient Position: Sitting, Cuff Size: Large)   Pulse 95   Temp 98.2 F (36.8 C) (Oral)   Ht 5' 7"  (1.702 m)   Wt 235 lb 6.4 oz (106.8 kg)   SpO2 100%   BMI 36.87 kg/m  Wt Readings from Last 3 Encounters:  04/01/19 235 lb 6.4 oz (106.8 kg)  08/15/18 228 lb (103.4 kg)  02/19/18 224 lb 6.4 oz (101.8 kg)    Physical Exam Vitals signs and nursing note reviewed.  Constitutional:      Appearance: She is well-developed.  HENT:     Head: Normocephalic and atraumatic.  Neck:     Musculoskeletal: Normal range of motion.  Cardiovascular:     Rate and Rhythm: Normal rate and regular rhythm.     Heart sounds: Normal heart sounds. No murmur. No friction rub. No gallop.   Pulmonary:     Effort: Pulmonary effort is normal. No tachypnea or respiratory distress.     Breath sounds: Normal breath sounds. No decreased breath sounds, wheezing, rhonchi or rales.  Chest:     Chest wall: No tenderness.  Abdominal:     General: Bowel sounds are normal.     Palpations: Abdomen is soft.  Musculoskeletal: Normal range of motion.  Skin:    General: Skin is warm and dry.  Neurological:     Mental Status: She is alert and oriented to person, place, and time.     Coordination: Coordination normal.  Psychiatric:        Behavior: Behavior normal. Behavior is cooperative.        Thought Content: Thought content normal.        Judgment: Judgment normal.        Patient has been counseled extensively about nutrition and exercise as well as the importance of adherence with medications and regular follow-up. The patient was given clear instructions to go to ER or return to medical center if symptoms don't improve, worsen or new problems develop. The patient verbalized understanding.   Follow-up: Return in about 3 months (around 07/02/2019).   [name], FNP-BC [facility] and [place], [place]   04/01/2019, 3:20 PM

## 2019-04-02 ENCOUNTER — Other Ambulatory Visit: Payer: Self-pay | Admitting: Nurse Practitioner

## 2019-04-02 ENCOUNTER — Encounter: Payer: Self-pay | Admitting: Nurse Practitioner

## 2019-04-02 LAB — CMP14+EGFR
ALT: 15 IU/L (ref 0–32)
AST: 17 IU/L (ref 0–40)
Albumin/Globulin Ratio: 1.4 (ref 1.2–2.2)
Albumin: 4.2 g/dL (ref 3.8–4.8)
Alkaline Phosphatase: 54 IU/L (ref 39–117)
BUN/Creatinine Ratio: 16 (ref 9–23)
BUN: 16 mg/dL (ref 6–24)
Bilirubin Total: 0.5 mg/dL (ref 0.0–1.2)
CO2: 22 mmol/L (ref 20–29)
Calcium: 9.5 mg/dL (ref 8.7–10.2)
Chloride: 103 mmol/L (ref 96–106)
Creatinine, Ser: 0.98 mg/dL (ref 0.57–1.00)
GFR calc Af Amer: 78 mL/min/{1.73_m2} (ref >59)
GFR calc non Af Amer: 67 mL/min/{1.73_m2} (ref >59)
Globulin, Total: 3.1 g/dL (ref 1.5–4.5)
Glucose: 112 mg/dL — ABNORMAL HIGH (ref 65–99)
Potassium: 3.8 mmol/L (ref 3.5–5.2)
Sodium: 140 mmol/L (ref 134–144)
Total Protein: 7.3 g/dL (ref 6.0–8.5)

## 2019-04-02 LAB — LIPID PANEL
Chol/HDL Ratio: 3.6 ratio (ref 0.0–4.4)
Cholesterol, Total: 215 mg/dL — ABNORMAL HIGH (ref 100–199)
HDL: 60 mg/dL (ref >39)
LDL Calculated: 120 mg/dL — ABNORMAL HIGH (ref 0–99)
Triglycerides: 177 mg/dL — ABNORMAL HIGH (ref 0–149)
VLDL Cholesterol Cal: 35 mg/dL (ref 5–40)

## 2019-04-02 LAB — HEMOGLOBIN A1C
Est. average glucose Bld gHb Est-mCnc: 105 mg/dL
Hgb A1c MFr Bld: 5.3 % (ref 4.8–5.6)

## 2019-04-02 MED ORDER — ATORVASTATIN CALCIUM 20 MG PO TABS: 20.0000 mg | ORAL_TABLET | Freq: Every day | ORAL | 0 refills | Status: DC

## 2019-04-03 ENCOUNTER — Telehealth: Payer: Self-pay

## 2019-04-03 NOTE — Telephone Encounter (Signed)
CMA spoke to patient to inform on results.  Pt. Verified DOB. Pt. Understood.  Pt. Is aware of RX.  Pt. Is asking if PCP can give her 3 month supply for her Hydrochlorothiazide.

## 2019-04-03 NOTE — Telephone Encounter (Signed)
-----   Message from [name], NP sent at 04/02/2019 10:40 AM EDT ----- A1c does not show diabetes or prediabetes at this time. Kidney, liver function and electrolytes including sodium and potassium are normal. Cholesterol is too high. This can increase your risk of heart attack or stroke. I am sending in a medication for you to take to help lower your cholesterol.

## 2019-04-06 ENCOUNTER — Other Ambulatory Visit: Payer: Self-pay | Admitting: Nurse Practitioner

## 2019-04-06 MED ORDER — HYDROCHLOROTHIAZIDE 25 MG PO TABS: 25.0000 mg | ORAL_TABLET | Freq: Every day | ORAL | 0 refills | Status: DC

## 2019-04-06 MED ORDER — CLONIDINE HCL 0.1 MG PO TABS: 0.1000 mg | ORAL_TABLET | Freq: Two times a day (BID) | ORAL | 2 refills | Status: DC

## 2019-06-13 ENCOUNTER — Ambulatory Visit: Payer: Self-pay

## 2019-07-01 ENCOUNTER — Other Ambulatory Visit: Payer: Self-pay

## 2019-07-01 ENCOUNTER — Ambulatory Visit: Payer: Self-pay | Attending: Nurse Practitioner | Admitting: Nurse Practitioner

## 2019-07-01 ENCOUNTER — Encounter: Payer: Self-pay | Admitting: Nurse Practitioner

## 2019-07-01 VITALS — BP 180/123 | HR 80 | Temp 98.0°F | Resp 16 | Ht 67.0 in | Wt 233.8 lb

## 2019-07-01 DIAGNOSIS — Z1211 Encounter for screening for malignant neoplasm of colon: Secondary | ICD-10-CM

## 2019-07-01 DIAGNOSIS — I10 Essential (primary) hypertension: Secondary | ICD-10-CM

## 2019-07-01 DIAGNOSIS — E782 Mixed hyperlipidemia: Secondary | ICD-10-CM

## 2019-07-01 DIAGNOSIS — K089 Disorder of teeth and supporting structures, unspecified: Secondary | ICD-10-CM

## 2019-07-01 MED ORDER — HYDROCHLOROTHIAZIDE 25 MG PO TABS: 25.0000 mg | ORAL_TABLET | Freq: Every day | ORAL | 0 refills | Status: DC

## 2019-07-01 MED ORDER — LISINOPRIL 10 MG PO TABS: 10.0000 mg | ORAL_TABLET | Freq: Every day | ORAL | 3 refills | Status: DC

## 2019-07-01 MED ORDER — ATORVASTATIN CALCIUM 20 MG PO TABS: 20.0000 mg | ORAL_TABLET | Freq: Every day | ORAL | 0 refills | Status: DC

## 2019-07-01 NOTE — Patient Instructions (Signed)
Please call the breast clinic to schedule: [phone] [id number] [phone]

## 2019-07-01 NOTE — Progress Notes (Signed)
Assessment & Plan:  Tammy Barajas was seen today for follow-up.  Diagnoses and all orders for this visit:  Essential hypertension -     hydrochlorothiazide (HYDRODIURIL) 25 MG tablet; Take 1 tablet (25 mg total) by mouth daily. -     lisinopril (ZESTRIL) 10 MG tablet; Take 1 tablet (10 mg total) by mouth daily. Continue all antihypertensives as prescribed.  Remember to bring in your blood pressure log with you for your follow up appointment.  DASH/Mediterranean Diets are healthier choices for HTN.    Colon cancer screening -     Fecal occult blood, imunochemical([facility]); Future -     Fecal occult blood, imunochemical([facility])  Mixed hyperlipidemia -     atorvastatin (LIPITOR) 20 MG tablet; Take 1 tablet (20 mg total) by mouth daily. -     Lipid panel INSTRUCTIONS: Work on a low fat, heart healthy diet and participate in regular aerobic exercise program by working out at least 150 minutes per week; 5 days a week-30 minutes per day. Avoid red meat, fried foods. junk foods, sodas, sugary drinks, unhealthy snacking, alcohol and smoking.  Drink at least 48oz of water per day and monitor your carbohydrate intake daily.   Poor dentition -     Ambulatory referral to Dentistry    Patient has been counseled on age-appropriate routine health concerns for screening and prevention. These are reviewed and up-to-date. Referrals have been placed accordingly. Immunizations are up-to-date or declined.    Subjective:   Chief Complaint  Patient presents with  . Follow-up    Pt. is here for BP Check. Pt. is requesting if she can get a dental referral.    HPI Tammy Barajas 53 y.o. female presents to office today for follow up to poorly controlled HTN. Upon entering the exam room patient was noted to be on her cell phone. When she finished her phone call she states to me that she was just told by the health department that her COVID test was positive. She had the test performed on  9-3. Last occurrence of symptoms was 8-28. She states she traveled to [place] a few weeks ago and when she returned on 8-23 she began to experience symptoms of chills, loose stools and loss of taste and smell. She states she has self quarantined since her symptom onset however earlier in the conversation she also told me she was smoking marijuana with someone last week. I have instructed her that she will need to let the people she traveled with know that she tested positive for COVID.   Essential Hypertension Blood pressure is poorly controlled. She is noncompliant with her medications and refuses to take any other blood pressure medications aside from her HCTZ which she does not take daily as prescribed either. She is also diet noncompliant in regard to sodium intake. She also refuses to take her statin. Stating all her issues are just related to the salt she had over the weekend. I have instructed her on numerous visits that poorly controlled HTN can lead to stroke, kidney failure, heart attack and even death.  BP Readings from Last 3 Encounters:  07/01/19 (!) 180/123  04/01/19 (!) 161/91  08/15/18 (!) 183/122    Review of Systems  Constitutional: Negative for fever, malaise/fatigue and weight loss.  HENT: Negative.  Negative for nosebleeds.   Eyes: Negative.  Negative for blurred vision, double vision and photophobia.  Respiratory: Negative.  Negative for cough and shortness of breath.   Cardiovascular: Negative.  Negative for  chest pain, palpitations and leg swelling.  Gastrointestinal: Negative.  Negative for heartburn, nausea and vomiting.  Musculoskeletal: Negative.  Negative for myalgias.  Neurological: Negative.  Negative for dizziness, focal weakness, seizures and headaches.  Psychiatric/Behavioral: Negative.  Negative for suicidal ideas.    Past Medical History:  Diagnosis Date  . Hyperlipidemia   . Hypertension     No past surgical history on file.  Family History  Problem  Relation Age of Onset  . Hypertension Mother   . Hypertension Brother     Social History Reviewed with no changes to be made today.   Outpatient Medications Prior to Visit  Medication Sig Dispense Refill  . aspirin 325 MG tablet Take 325 mg by mouth daily.    . Multiple Vitamin (MULTIVITAMIN) tablet Take 1 tablet by mouth daily.    . hydrochlorothiazide (HYDRODIURIL) 25 MG tablet Take 1 tablet (25 mg total) by mouth daily. 90 tablet 0  . atorvastatin (LIPITOR) 20 MG tablet Take 1 tablet (20 mg total) by mouth daily. (Patient not taking: Reported on 07/01/2019) 90 tablet 0  . cloNIDine (CATAPRES) 0.1 MG tablet Take 1 tablet (0.1 mg total) by mouth 2 (two) times daily. (Patient not taking: Reported on 07/01/2019) 180 tablet 2   No facility-administered medications prior to visit.     No Known Allergies     Objective:    BP (!) 180/123 (BP Location: Right Arm, Patient Position: Sitting, Cuff Size: Large)   Pulse 80   Temp 98 F (36.7 C) (Oral)   Resp 16   Ht 5\' 7"  (1.702 m)   Wt 233 lb 12.8 oz (106.1 kg)   LMP 06/18/2019 (Within Days)   BMI 36.62 kg/m  Wt Readings from Last 3 Encounters:  07/01/19 233 lb 12.8 oz (106.1 kg)  04/01/19 235 lb 6.4 oz (106.8 kg)  08/15/18 228 lb (103.4 kg)    Physical Exam Vitals signs and nursing note reviewed.  Constitutional:      Appearance: She is well-developed.  HENT:     Head: Normocephalic and atraumatic.  Neck:     Musculoskeletal: Normal range of motion.  Cardiovascular:     Rate and Rhythm: Normal rate.  Pulmonary:     Effort: Pulmonary effort is normal. No tachypnea.     Breath sounds: No decreased breath sounds.  Musculoskeletal: Normal range of motion.  Neurological:     Mental Status: She is alert and oriented to person, place, and time.     Coordination: Coordination normal.  Psychiatric:        Behavior: Behavior normal. Behavior is cooperative.        Thought Content: Thought content normal.        Judgment: Judgment  normal.        Patient has been counseled extensively about nutrition and exercise as well as the importance of adherence with medications and regular follow-up. The patient was given clear instructions to go to ER or return to medical center if symptoms don't improve, worsen or new problems develop. The patient verbalized understanding.   Follow-up: Return for PAP SMEAR  then 2-3 weeks with [facility] FOR BP RECHECK.   [name], FNP-BC [facility] [place], [place]   07/01/2019, 5:59 PM

## 2019-07-02 ENCOUNTER — Ambulatory Visit: Payer: Self-pay | Admitting: Nurse Practitioner

## 2019-07-02 LAB — LIPID PANEL
Chol/HDL Ratio: 3.3 ratio (ref 0.0–4.4)
Cholesterol, Total: 185 mg/dL (ref 100–199)
HDL: 56 mg/dL (ref >39)
LDL Chol Calc (NIH): 107 mg/dL — ABNORMAL HIGH (ref 0–99)
Triglycerides: 127 mg/dL (ref 0–149)
VLDL Cholesterol Cal: 22 mg/dL (ref 5–40)

## 2019-07-03 NOTE — Progress Notes (Signed)
CMA attempt to reach patient to inform on lab results.   No answer and LVM.

## 2019-07-11 ENCOUNTER — Telehealth (HOSPITAL_COMMUNITY): Payer: Self-pay

## 2019-07-11 NOTE — Telephone Encounter (Signed)
Telephoned patient at mobile number. Left voice message to call [facility] and schedule appt.

## 2019-07-15 ENCOUNTER — Ambulatory Visit: Payer: Self-pay | Attending: Family Medicine | Admitting: Pharmacist

## 2019-07-15 ENCOUNTER — Other Ambulatory Visit: Payer: Self-pay

## 2019-07-15 ENCOUNTER — Encounter: Payer: Self-pay | Admitting: Pharmacist

## 2019-07-15 VITALS — BP 168/113 | HR 87

## 2019-07-15 DIAGNOSIS — I10 Essential (primary) hypertension: Secondary | ICD-10-CM

## 2019-07-15 MED ORDER — CLONIDINE HCL 0.1 MG PO TABS: 0.1000 mg | ORAL_TABLET | Freq: Once | ORAL | Status: DC

## 2019-07-15 MED ORDER — LISINOPRIL 10 MG PO TABS: 10.0000 mg | ORAL_TABLET | Freq: Every day | ORAL | 1 refills | Status: DC

## 2019-07-15 MED ORDER — CLONIDINE HCL 0.1 MG PO TABS: 0.2000 mg | ORAL_TABLET | Freq: Once | ORAL | Status: DC

## 2019-07-15 MED ORDER — CLONIDINE HCL 0.1 MG PO TABS
0.2000 mg | ORAL_TABLET | Freq: Once | ORAL | Status: AC
  Administered 2019-07-15: 17:00:00 0.2 mg via ORAL

## 2019-07-15 NOTE — Progress Notes (Signed)
   S:    PCP: Zelda   Patient arrives in good spirits. Presents to the clinic for hypertension evaluation, counseling, and management.  Patient was referred and last seen by Primary Care Provider on 07/01/19.   Patient denies adherence with medications.  Current BP Medications include: HCTZ 25 mg daily, Lisinopril 10 mg (not taking d/t cough)  Antihypertensives tried in the past include: amlodipine (cramps, side effects), lisinopril (cough), losartan  Dietary habits include: reports limiting salt; denies drinking caffeine  Exercise habits include: patient walks daily  Family / Social history:  - FHx: HTN (mother, brother) - Tobacco: never smoker - Alcohol: occasionally - Illicit substances: smokes marijuana occasionally   O:  Initial reading: 204/134, HR 98   Consulted with PCP. Clonidine 0.2 mg given x1.   2nd reading after 30 minutes: 168/113, HR 87   Home BP readings: does not check BP at home  Last 3 Office BP readings: BP Readings from Last 3 Encounters:  07/15/19 (!) 168/113  07/01/19 (!) 180/123  04/01/19 (!) 161/91   BMET    Component Value Date/Time   NA 140 04/01/2019 1603   K 3.8 04/01/2019 1603   CL 103 04/01/2019 1603   CO2 22 04/01/2019 1603   GLUCOSE 112 (H) 04/01/2019 1603   GLUCOSE 92 05/10/2015 1319   BUN 16 04/01/2019 1603   CREATININE 0.98 04/01/2019 1603   CREATININE 1.07 05/10/2015 1319   CALCIUM 9.5 04/01/2019 1603   GFRNONAA 67 04/01/2019 1603   GFRNONAA 71 07/03/2014 1234   GFRAA 78 04/01/2019 1603   GFRAA 82 07/03/2014 1234   Renal function: CrCl cannot be calculated (Patient's most recent lab result is older than the maximum 21 days allowed.).  Clinical ASCVD: No  The 10-year ASCVD risk score Mikey Bussing DC Jr., et al., 2013) is: 9.3%   Values used to calculate the score:     Age: 53 years     Sex: Female     Is Non-Hispanic African American: Yes     Diabetic: No     Tobacco smoker: No     Systolic Blood Pressure: [id number] mmHg     Is BP  treated: Yes     HDL Cholesterol: 56 mg/dL     Total Cholesterol: 185 mg/dL   A/P: Hypertension longstanding currently uncontrolled on current medications. BP Goal = <130/80 mmHg. Patient is not adherent with current medications. After discussion, pt is willing to try lisinopril again. She will resume HCTZ.  -Continued HCTZ and lisinopril.   -Counseled on lifestyle modifications for blood pressure control including reduced dietary sodium, increased exercise, adequate sleep  Results reviewed and written information provided.   Total time in face-to-face counseling 30 minutes.   F/U Clinic Visit in 2 weeks.    Benard Halsted, PharmD, [place] [phone]

## 2019-07-18 ENCOUNTER — Ambulatory Visit: Payer: Self-pay | Attending: Family Medicine

## 2019-07-18 ENCOUNTER — Other Ambulatory Visit: Payer: Self-pay

## 2019-07-29 ENCOUNTER — Other Ambulatory Visit: Payer: Self-pay

## 2019-07-29 ENCOUNTER — Ambulatory Visit: Payer: Self-pay | Attending: Family Medicine | Admitting: Pharmacist

## 2019-07-30 ENCOUNTER — Other Ambulatory Visit: Payer: Self-pay | Admitting: Nurse Practitioner

## 2019-07-30 DIAGNOSIS — Z1231 Encounter for screening mammogram for malignant neoplasm of breast: Secondary | ICD-10-CM

## 2019-07-31 ENCOUNTER — Telehealth: Payer: Self-pay | Admitting: Nurse Practitioner

## 2019-07-31 NOTE — Telephone Encounter (Signed)
(   Pt was aware of the dateline for today ) Pt was sent a letter from financial dept. Inform them, that the application they submitted was incomplete, since they were missing some documentation at the time of the appointment, Pt need to reschedule and resubmit all new papers and application for [employer] and [facility], P.S. old documents has been sent back by mail to the Pt and Pt. need to make a new appt

## 2019-08-04 ENCOUNTER — Other Ambulatory Visit: Payer: Self-pay

## 2019-08-04 ENCOUNTER — Encounter: Payer: Self-pay | Admitting: Pharmacist

## 2019-08-04 ENCOUNTER — Ambulatory Visit: Payer: Self-pay | Attending: Nurse Practitioner | Admitting: Pharmacist

## 2019-08-04 VITALS — BP 162/98

## 2019-08-04 DIAGNOSIS — I10 Essential (primary) hypertension: Secondary | ICD-10-CM

## 2019-08-04 NOTE — Progress Notes (Signed)
   S:    PCP: Tammy Barajas   Patient arrives in good spirits. Presents to the clinic for hypertension evaluation, counseling, and management.  Patient was referred and last seen by Primary Care Provider on 07/01/19. I saw her on 9/22. Pt reported non-compliance at that visit.   Patient denies adherence with medications.  Current BP Medications include: HCTZ 25 mg daily, Lisinopril 10 mg (not taking d/t HA)  Antihypertensives tried in the past include: amlodipine (cramps, side effects), lisinopril (cough), losartan  Dietary habits include: reports limiting salt; denies drinking caffeine  Exercise habits include: patient walks daily  Family / Social history:  - FHx: HTN (mother, brother) - Tobacco: never smoker - Alcohol: occasionally - Illicit substances: smokes marijuana occasionally   O:  Initial reading: 162/98, HR 92   Home BP readings: does not check BP at home  Last 3 Office BP readings: BP Readings from Last 3 Encounters:  08/04/19 (!) 162/98  07/15/19 (!) 168/113  07/01/19 (!) 180/123   BMET    Component Value Date/Time   NA 140 04/01/2019 1603   K 3.8 04/01/2019 1603   CL 103 04/01/2019 1603   CO2 22 04/01/2019 1603   GLUCOSE 112 (H) 04/01/2019 1603   GLUCOSE 92 05/10/2015 1319   BUN 16 04/01/2019 1603   CREATININE 0.98 04/01/2019 1603   CREATININE 1.07 05/10/2015 1319   CALCIUM 9.5 04/01/2019 1603   GFRNONAA 67 04/01/2019 1603   GFRNONAA 71 07/03/2014 1234   GFRAA 78 04/01/2019 1603   GFRAA 82 07/03/2014 1234   Renal function: CrCl cannot be calculated (Patient's most recent lab result is older than the maximum 21 days allowed.).  Clinical ASCVD: No  The 10-year ASCVD risk score Mikey Bussing DC Jr., et al., 2013) is: 8.1%   Values used to calculate the score:     Age: 53 years     Sex: Female     Is Non-Hispanic African American: Yes     Diabetic: No     Tobacco smoker: No     Systolic Blood Pressure: 0000000 mmHg     Is BP treated: Yes     HDL Cholesterol: 56  mg/dL     Total Cholesterol: 185 mg/dL   A/P: Hypertension longstanding currently uncontrolled on current medications. BP Goal = <130/80 mmHg. Patient is not adherent with current medications but refuses to add any medications at this time. Of note, she does note that low dose Norvasc has not caused side effects. Recommend to add at upcoming physical appointment with PCP if pt is amenable.  -Continued current regimen -Counseled on lifestyle modifications for blood pressure control including reduced dietary sodium, increased exercise, adequate sleep  Results reviewed and written information provided.   Total time in face-to-face counseling 30 minutes.   F/U Clinic Visit 10/20 for physical with PCP.    Benard Halsted, PharmD, [place] [phone]

## 2019-08-12 ENCOUNTER — Other Ambulatory Visit: Payer: Self-pay

## 2019-08-12 ENCOUNTER — Encounter: Payer: Self-pay | Admitting: Nurse Practitioner

## 2019-08-12 ENCOUNTER — Ambulatory Visit: Payer: Self-pay | Attending: Nurse Practitioner | Admitting: Nurse Practitioner

## 2019-08-12 VITALS — BP 165/111 | HR 89 | Temp 98.2°F | Ht 67.0 in | Wt 233.0 lb

## 2019-08-12 DIAGNOSIS — Z124 Encounter for screening for malignant neoplasm of cervix: Secondary | ICD-10-CM

## 2019-08-12 DIAGNOSIS — I10 Essential (primary) hypertension: Secondary | ICD-10-CM

## 2019-08-12 MED ORDER — CLONIDINE HCL 0.1 MG PO TABS
0.2000 mg | ORAL_TABLET | Freq: Once | ORAL | Status: AC
  Administered 2019-08-12: 0.2 mg via ORAL

## 2019-08-12 NOTE — Progress Notes (Signed)
Assessment & Plan:  Tammy Barajas was seen today for annual exam.  Diagnoses and all orders for this visit:  Encounter for Papanicolaou smear for cervical cancer screening -     Cytology - PAP -     Cervicovaginal ancillary only  Essential hypertension -     cloNIDine (CATAPRES) tablet 0.2 mg Blood pressure is elevated. She is non adherent with medications and DASH diet. Drinks alcohol, smokes marijuana and sometimes uses cocaine.   Patient has been counseled on age-appropriate routine health concerns for screening and prevention. These are reviewed and up-to-date. Referrals have been placed accordingly. Immunizations are up-to-date or declined.    Subjective:   Chief Complaint  Patient presents with   Annual Exam    Pt. is here for physical and pap smear.    HPI Tammy Barajas 53 y.o. female presents to office today   Review of Systems  Constitutional: Negative.  Negative for chills, fever, malaise/fatigue and weight loss.  Respiratory: Negative.  Negative for cough, shortness of breath and wheezing.   Cardiovascular: Negative.  Negative for chest pain, orthopnea and leg swelling.  Gastrointestinal: Negative for abdominal pain.  Genitourinary: Negative.  Negative for flank pain.  Skin: Negative.  Negative for rash.  Psychiatric/Behavioral: Negative for suicidal ideas.    Past Medical History:  Diagnosis Date   Hyperlipidemia    Hypertension     History reviewed. No pertinent surgical history.  Family History  Problem Relation Age of Onset   Hypertension Mother    Hypertension Brother     Social History Reviewed with no changes to be made today.   Outpatient Medications Prior to Visit  Medication Sig Dispense Refill   aspirin 325 MG tablet Take 325 mg by mouth daily.     atorvastatin (LIPITOR) 20 MG tablet Take 1 tablet (20 mg total) by mouth daily. 90 tablet 0   hydrochlorothiazide (HYDRODIURIL) 25 MG tablet Take 1 tablet (25 mg total) by mouth daily. 90  tablet 0   Multiple Vitamin (MULTIVITAMIN) tablet Take 1 tablet by mouth daily.     lisinopril (ZESTRIL) 10 MG tablet Take 1 tablet (10 mg total) by mouth daily. (Patient not taking: Reported on 08/12/2019) 90 tablet 1   No facility-administered medications prior to visit.     No Known Allergies     Objective:    BP (!) 176/112 (BP Location: Left Arm, Patient Position: Sitting, Cuff Size: Large)    Pulse 89    Temp 98.2 F (36.8 C) (Oral)    Ht 5\' 7"  (1.702 m)    Wt 233 lb (105.7 kg)    SpO2 100%    BMI 36.49 kg/m  Wt Readings from Last 3 Encounters:  08/12/19 233 lb (105.7 kg)  07/01/19 233 lb 12.8 oz (106.1 kg)  04/01/19 235 lb 6.4 oz (106.8 kg)    Physical Exam Constitutional:      General: She is not in acute distress.    Appearance: She is well-developed. She is not diaphoretic.  HENT:     Head: Normocephalic and atraumatic.     Right Ear: External ear normal.     Left Ear: External ear normal.     Nose: Nose normal.     Mouth/Throat:     Pharynx: No oropharyngeal exudate.  Eyes:     General: No scleral icterus.       Right eye: No discharge.        Left eye: No discharge.     Conjunctiva/sclera:  Conjunctivae normal.     Pupils: Pupils are equal, round, and reactive to light.  Neck:     Musculoskeletal: Normal range of motion and neck supple.     Thyroid: No thyromegaly.     Trachea: No tracheal deviation.  Cardiovascular:     Rate and Rhythm: Normal rate and regular rhythm.     Heart sounds: Normal heart sounds. No murmur. No friction rub.  Pulmonary:     Effort: Pulmonary effort is normal. No respiratory distress.     Breath sounds: Normal breath sounds. No decreased breath sounds, wheezing, rhonchi or rales.  Chest:     Chest wall: No tenderness.     Breasts:        Right: No inverted nipple, mass, nipple discharge, skin change or tenderness.        Left: No inverted nipple, mass, nipple discharge, skin change or tenderness.  Abdominal:     General:  Bowel sounds are normal. There is no distension.     Palpations: Abdomen is soft. There is no mass.     Tenderness: There is no abdominal tenderness. There is no guarding or rebound.     Hernia: There is no hernia in the left inguinal area.  Genitourinary:    Labia:        Right: No rash, tenderness, lesion or injury.        Left: No rash, tenderness, lesion or injury.      Vagina: Normal. No vaginal discharge, erythema, tenderness or bleeding.     Cervix: No cervical motion tenderness, discharge or friability.     Uterus: Not tender.      Adnexa:        Right: No mass, tenderness or fullness.         Left: No mass, tenderness or fullness.       Rectum: No mass, anal fissure or external hemorrhoid. Normal anal tone.  Musculoskeletal: Normal range of motion.        General: No tenderness or deformity.  Lymphadenopathy:     Cervical: No cervical adenopathy.  Skin:    General: Skin is warm and dry.     Coloration: Skin is not pale.     Findings: No erythema or rash.  Neurological:     Mental Status: She is alert and oriented to person, place, and time.     Cranial Nerves: No cranial nerve deficit.     Coordination: Coordination normal.  Psychiatric:        Behavior: Behavior normal.        Thought Content: Thought content normal.        Judgment: Judgment normal.        Patient has been counseled extensively about nutrition and exercise as well as the importance of adherence with medications and regular follow-up. The patient was given clear instructions to go to ER or return to medical center if symptoms don't improve, worsen or new problems develop. The patient verbalized understanding.   Follow-up: Return in about 3 months (around 11/12/2019) for HTN.   [name], FNP-BC [facility] and [place], [place]   08/12/2019, 10:48 AM

## 2019-08-14 LAB — CYTOLOGY - PAP
Comment: NEGATIVE
Diagnosis: NEGATIVE
High risk HPV: NEGATIVE

## 2019-08-19 ENCOUNTER — Ambulatory Visit
Admission: RE | Admit: 2019-08-19 | Discharge: 2019-08-19 | Disposition: A | Discharge status: Home / Self Care | Payer: No Typology Code available for payment source | Source: Ambulatory Visit | Attending: Nurse Practitioner | Admitting: Nurse Practitioner

## 2019-08-19 ENCOUNTER — Other Ambulatory Visit: Payer: Self-pay

## 2019-08-19 DIAGNOSIS — Z1231 Encounter for screening mammogram for malignant neoplasm of breast: Secondary | ICD-10-CM

## 2019-08-19 LAB — FECAL OCCULT BLOOD, IMMUNOCHEMICAL

## 2019-08-20 ENCOUNTER — Other Ambulatory Visit: Payer: Self-pay | Admitting: Nurse Practitioner

## 2019-08-20 LAB — CERVICOVAGINAL ANCILLARY ONLY
Bacterial Vaginitis (gardnerella): POSITIVE — AB
Candida Glabrata: NEGATIVE
Candida Vaginitis: NEGATIVE
Chlamydia: NEGATIVE
Comment: NEGATIVE
Comment: NEGATIVE
Comment: NEGATIVE
Comment: NEGATIVE
Comment: NEGATIVE
Comment: NORMAL
Neisseria Gonorrhea: NEGATIVE
Trichomonas: NEGATIVE

## 2019-08-20 MED ORDER — METRONIDAZOLE 500 MG PO TABS: 500.0000 mg | ORAL_TABLET | Freq: Two times a day (BID) | ORAL | 0 refills | Status: AC

## 2019-10-15 ENCOUNTER — Other Ambulatory Visit: Payer: Self-pay | Admitting: Nurse Practitioner

## 2019-10-15 DIAGNOSIS — I10 Essential (primary) hypertension: Secondary | ICD-10-CM

## 2019-10-15 NOTE — Telephone Encounter (Signed)
1) Medication(s) Requested (by name): -hydrochlorothiazide (HYDRODIURIL) 25 MG tablet   2) Pharmacy of Choice: -[place], [place]

## 2019-11-12 ENCOUNTER — Ambulatory Visit: Payer: Self-pay | Admitting: Nurse Practitioner

## 2019-11-27 ENCOUNTER — Other Ambulatory Visit: Payer: Self-pay

## 2019-11-27 ENCOUNTER — Encounter: Payer: Self-pay | Admitting: Nurse Practitioner

## 2019-11-27 ENCOUNTER — Ambulatory Visit: Payer: Self-pay | Attending: Nurse Practitioner | Admitting: Nurse Practitioner

## 2019-11-27 DIAGNOSIS — K089 Disorder of teeth and supporting structures, unspecified: Secondary | ICD-10-CM

## 2019-11-27 DIAGNOSIS — I10 Essential (primary) hypertension: Secondary | ICD-10-CM

## 2019-11-27 DIAGNOSIS — D170 Benign lipomatous neoplasm of skin and subcutaneous tissue of head, face and neck: Secondary | ICD-10-CM

## 2019-11-27 NOTE — Progress Notes (Signed)
Virtual Visit via Telephone Note Due to national recommendations of social distancing due to [place], telehealth visit is felt to be most appropriate for this patient at this time.  I discussed the limitations, risks, security and privacy concerns of performing an evaluation and management service by telephone and the availability of in person appointments. I also discussed with the patient that there may be a patient responsible charge related to this service. The patient expressed understanding and agreed to proceed.    I connected with Tammy Barajas on 11/28/19  at   3:10 PM EST  EDT by telephone and verified that I am speaking with the correct person using two identifiers.   Consent I discussed the limitations, risks, security and privacy concerns of performing an evaluation and management service by telephone and the availability of in person appointments. I also discussed with the patient that there may be a patient responsible charge related to this service. The patient expressed understanding and agreed to proceed.   Location of Patient: Private Residence   Location of Provider: [place] [facility] [place] participating in Telemedicine visit: [name] FNP-BC [place]    History of Present Illness: Telemedicine visit for: Follow Up  has a past medical history of Hyperlipidemia and Hypertension.   Wants cyst/lipoma removal    HTN Poorly controlled. We have tried numerous antihypertensives which she reports adverse reactions to. Some of which she would request to restart after reporting side effects. She endorses noncompliance.  She does not check her blood pressure at home and she is not diet compliant however she states she has started exercising and trying to lose weight. Medications she has been on in the past include: Hyzaar, HCTZ, lisinopril, Losartan and amlodipine.  She smokes marijuana recreationally and has a  previous history of cocaine use.  BP Readings from Last 3 Encounters:  08/12/19 (!) 165/111  08/04/19 (!) 162/98  07/15/19 (!) 168/113   Wt Readings from Last 3 Encounters:  08/12/19 233 lb (105.7 kg)  07/01/19 233 lb 12.8 oz (106.1 kg)  04/01/19 235 lb 6.4 oz (106.8 kg)   Lipoma Patient complains of skin nodule. The nodule is located on the forehead. It has been present for several years. Pain is rated 0/10. Interventions to date: none. Patient denies tobacco use. Patient does not have a history of diabetes. She denies any injury or trauma to her face or head.    Past Medical History:  Diagnosis Date  . Hyperlipidemia   . Hypertension     History reviewed. No pertinent surgical history.  Family History  Problem Relation Age of Onset  . Hypertension Mother   . Hypertension Brother     Social History   Socioeconomic History  . Marital status: Single    Spouse name: Not on file  . Number of children: Not on file  . Years of education: Not on file  . Highest education level: Not on file  Occupational History  . Not on file  Tobacco Use  . Smoking status: Never Smoker  . Smokeless tobacco: Never Used  Substance and Sexual Activity  . Alcohol use: Yes    Comment: occassionally  . Drug use: Yes    Frequency: 3.0 times per week    Types: Marijuana    Comment: last use 05/09/15  . Sexual activity: Yes    Birth control/protection: None, Condom  Other Topics Concern  . Not on file  Social  History Narrative  . Not on file   Social Determinants of Health   Financial Resource Strain:   . Difficulty of Paying Living Expenses: Not on file  Food Insecurity:   . Worried About Charity fundraiser in the Last Year: Not on file  . Ran Out of Food in the Last Year: Not on file  Transportation Needs:   . Lack of Transportation (Medical): Not on file  . Lack of Transportation (Non-Medical): Not on file  Physical Activity:   . Days of Exercise per Week: Not on file  . Minutes  of Exercise per Session: Not on file  Stress:   . Feeling of Stress : Not on file  Social Connections:   . Frequency of Communication with Friends and Family: Not on file  . Frequency of Social Gatherings with Friends and Family: Not on file  . Attends Religious Services: Not on file  . Active Member of Clubs or Organizations: Not on file  . Attends Archivist Meetings: Not on file  . Marital Status: Not on file     Observations/Objective: Awake, alert and oriented x 3   Review of Systems  Constitutional: Negative for fever, malaise/fatigue and weight loss.       Poor dentition  HENT: Negative.  Negative for nosebleeds.   Eyes: Negative.  Negative for blurred vision, double vision and photophobia.  Respiratory: Negative.  Negative for cough and shortness of breath.   Cardiovascular: Negative.  Negative for chest pain, palpitations and leg swelling.  Gastrointestinal: Negative.  Negative for heartburn, nausea and vomiting.  Musculoskeletal: Negative.  Negative for myalgias.  Skin:       SEE HPI  Neurological: Negative.  Negative for dizziness, focal weakness, seizures and headaches.  Psychiatric/Behavioral: Negative.  Negative for suicidal ideas.    Assessment and Plan: Tammy Barajas was seen today for follow-up.  Diagnoses and all orders for this visit:  Essential hypertension  Lipoma of forehead -     Ambulatory referral to General Surgery  Poor dentition -     Ambulatory referral to Dentistry     Follow Up Instructions Return in about 3 months (around 02/24/2020).     I discussed the assessment and treatment plan with the patient. The patient was provided an opportunity to ask questions and all were answered. The patient agreed with the plan and demonstrated an understanding of the instructions.   The patient was advised to call back or seek an in-person evaluation if the symptoms worsen or if the condition fails to improve as anticipated.  I provided 14  minutes of non-face-to-face time during this encounter including median intraservice time, reviewing previous notes, labs, imaging, medications and explaining diagnosis and management.  [name], FNP-BC

## 2019-11-28 ENCOUNTER — Encounter: Payer: Self-pay | Admitting: Nurse Practitioner

## 2019-12-04 ENCOUNTER — Ambulatory Visit: Payer: Self-pay | Attending: Nurse Practitioner

## 2019-12-04 ENCOUNTER — Other Ambulatory Visit: Payer: Self-pay

## 2019-12-04 DIAGNOSIS — E782 Mixed hyperlipidemia: Secondary | ICD-10-CM

## 2019-12-04 DIAGNOSIS — I10 Essential (primary) hypertension: Secondary | ICD-10-CM

## 2019-12-04 DIAGNOSIS — R7303 Prediabetes: Secondary | ICD-10-CM

## 2019-12-05 LAB — LIPID PANEL
Chol/HDL Ratio: 3.5 ratio (ref 0.0–4.4)
Cholesterol, Total: 221 mg/dL — ABNORMAL HIGH (ref 100–199)
HDL: 64 mg/dL (ref >39)
LDL Chol Calc (NIH): 137 mg/dL — ABNORMAL HIGH (ref 0–99)
Triglycerides: 114 mg/dL (ref 0–149)
VLDL Cholesterol Cal: 20 mg/dL (ref 5–40)

## 2019-12-05 LAB — HEMOGLOBIN A1C
Est. average glucose Bld gHb Est-mCnc: 114 mg/dL
Hgb A1c MFr Bld: 5.6 % (ref 4.8–5.6)

## 2019-12-05 LAB — CMP14+EGFR
ALT: 15 IU/L (ref 0–32)
AST: 21 IU/L (ref 0–40)
Albumin/Globulin Ratio: 1.3 (ref 1.2–2.2)
Albumin: 4.3 g/dL (ref 3.8–4.9)
Alkaline Phosphatase: 65 IU/L (ref 39–117)
BUN/Creatinine Ratio: 12 (ref 9–23)
BUN: 13 mg/dL (ref 6–24)
Bilirubin Total: 0.4 mg/dL (ref 0.0–1.2)
CO2: 22 mmol/L (ref 20–29)
Calcium: 9.9 mg/dL (ref 8.7–10.2)
Chloride: 99 mmol/L (ref 96–106)
Creatinine, Ser: 1.13 mg/dL — ABNORMAL HIGH (ref 0.57–1.00)
GFR calc Af Amer: 65 mL/min/{1.73_m2} (ref >59)
GFR calc non Af Amer: 56 mL/min/{1.73_m2} — ABNORMAL LOW (ref >59)
Globulin, Total: 3.3 g/dL (ref 1.5–4.5)
Glucose: 108 mg/dL — ABNORMAL HIGH (ref 65–99)
Potassium: 4 mmol/L (ref 3.5–5.2)
Sodium: 139 mmol/L (ref 134–144)
Total Protein: 7.6 g/dL (ref 6.0–8.5)

## 2019-12-05 LAB — CBC
Hematocrit: 38.6 % (ref 34.0–46.6)
Hemoglobin: 12.8 g/dL (ref 11.1–15.9)
MCH: 29.6 pg (ref 26.6–33.0)
MCHC: 33.2 g/dL (ref 31.5–35.7)
MCV: 89 fL (ref 79–97)
Platelets: 323 10*3/uL (ref 150–450)
RBC: 4.32 x10E6/uL (ref 3.77–5.28)
RDW: 13.1 % (ref 11.7–15.4)
WBC: 5.1 10*3/uL (ref 3.4–10.8)

## 2019-12-05 LAB — VITAMIN D 25 HYDROXY (VIT D DEFICIENCY, FRACTURES): Vit D, 25-Hydroxy: 23 ng/mL — ABNORMAL LOW (ref 30.0–100.0)

## 2019-12-10 ENCOUNTER — Encounter: Payer: Self-pay | Admitting: Surgery

## 2019-12-10 ENCOUNTER — Ambulatory Visit (INDEPENDENT_AMBULATORY_CARE_PROVIDER_SITE_OTHER): Payer: Self-pay | Admitting: Surgery

## 2019-12-10 ENCOUNTER — Other Ambulatory Visit: Payer: Self-pay

## 2019-12-10 VITALS — BP 169/114 | HR 98 | Temp 98.1°F | Resp 12 | Ht 67.0 in | Wt 235.4 lb

## 2019-12-10 DIAGNOSIS — D170 Benign lipomatous neoplasm of skin and subcutaneous tissue of head, face and neck: Secondary | ICD-10-CM

## 2019-12-10 NOTE — Patient Instructions (Addendum)
You are scheduled to have the lipoma on your forehead removed here in the office. Please see your appointment below.   Please call the office if you have any questions or concerns.  Lipoma Removal  Lipoma removal is a surgical procedure to remove a lipoma, which is a noncancerous (benign) tumor that is made up of fat cells. Most lipomas are small and painless and do not require treatment. They can form in many areas of the body but are most common under the skin of the back, arms, shoulders, buttocks, and thighs. You may need lipoma removal if you have a lipoma that is large, growing, or causing discomfort. Lipoma removal may also be done for cosmetic reasons. Tell a health care provider about:  Any allergies you have.  All medicines you are taking, including vitamins, herbs, eye drops, creams, and over-the-counter medicines.  Any problems you or family members have had with anesthetic medicines.  Any blood disorders you have.  Any surgeries you have had.  Any medical conditions you have.  Whether you are pregnant or may be pregnant. What are the risks? Generally, this is a safe procedure. However, problems may occur, including:  Infection.  Bleeding.  Scarring.  Allergic reactions to medicines.  Damage to nearby structures or organs, such as damage to nerves or blood vessels near the lipoma. What happens before the procedure? Staying hydrated Follow instructions from your health care provider about hydration, which may include:  Up to 2 hours before the procedure - you may continue to drink clear liquids, such as water, clear fruit juice, black coffee, and plain tea. Eating and drinking restrictions Follow instructions from your health care provider about eating and drinking, which may include:  8 hours before the procedure - stop eating heavy meals or foods, such as meat, fried foods, or fatty foods.  6 hours before the procedure - stop eating light meals or foods,  such as toast or cereal.  6 hours before the procedure - stop drinking milk or drinks that contain milk.  2 hours before the procedure - stop drinking clear liquids. Medicines Ask your health care provider about:  Changing or stopping your regular medicines. This is especially important if you are taking diabetes medicines or blood thinners.  Taking medicines such as aspirin and ibuprofen. These medicines can thin your blood. Do not take these medicines unless your health care provider tells you to take them.  Taking over-the-counter medicines, vitamins, herbs, and supplements. General instructions  You will have a physical exam. Your health care provider will check the size of the lipoma and whether it can be moved easily.  You may have a biopsy and imaging tests, such as X-rays, a CT scan, and an MRI.  Do not use any products that contain nicotine or tobacco for at least 4 weeks before the procedure. These products include cigarettes, e-cigarettes, and chewing tobacco. If you need help quitting, ask your health care provider.  Ask your health care provider: ? How your surgery site will be marked. ? What steps will be taken to help prevent infection. These may include:  Washing skin with a germ-killing soap.  Taking antibiotic medicine.  Plan to have someone take you home from the hospital or clinic.  If you will be going home right after the procedure, plan to have someone with you for 24 hours. What happens during the procedure?   An IV will be inserted into one of your veins.  You will be given one  or more of the following: ? A medicine to help you relax (sedative). ? A medicine to numb the area (local anesthetic). ? A medicine to make you fall asleep (general anesthetic). ? A medicine that is injected into an area of your body to numb everything below the injection site (regional anesthetic).  An incision will be made over the lipoma or very near the lipoma. The  incision may be made in a natural skin line or crease.  Tissues, nerves, and blood vessels near the lipoma will be moved out of the way.  The lipoma and the capsule that surrounds it will be separated from the surrounding tissues.  The lipoma will be removed.  The incision may be closed with stitches (sutures).  A bandage (dressing) will be placed over the incision. The procedure may vary among health care providers and hospitals. What happens after the procedure?  Your blood pressure, heart rate, breathing rate, and blood oxygen level will be monitored until you leave the hospital or clinic.  If you were prescribed an antibiotic medicine, use it as told by your health care provider. Do not stop using the antibiotic even if you start to feel better.  If you were given a sedative during the procedure, it can affect you for several hours. Do not drive or operate machinery until your health care provider says that it is safe. Summary  Before the procedure, follow instructions from your health care provider about eating and drinking, and changing or stopping your regular medicines. This is especially important if you are taking diabetes medicines or blood thinners.  After the lipoma is removed, the incision may be closed with stitches (sutures) and covered with a bandage (dressing).  If you were given a sedative during the procedure, it can affect you for several hours. Do not drive or operate machinery until your health care provider says that it is safe. This information is not intended to replace advice given to you by your health care provider. Make sure you discuss any questions you have with your health care provider. Document Revised: 05/26/2019 Document Reviewed: 05/26/2019 Elsevier Patient Education  [place].

## 2019-12-10 NOTE — Progress Notes (Signed)
12/10/2019  Reason for Visit:  Forehead lipoma  Referring Provider:  [name], NP  History of Present Illness: Tammy Barajas is a 54 y.o. female presenting for evaluation of a forehead mass.  Patient reports that she had an accident many years ago and hit her forehead against a cabinet door.  She believe this was about 10 years ago.  She had noted that slowly she developed a small mass in the middle of her forehead.  It has overall grown in size, though she feels that more recently it has stayed the same size.  Denies any pain from it, any drainage.  Reports that it feels soft.  Denies any other symptoms associated with it.  However, she wants the mass excised as she feels very self-conscious about it and hides her forehead with her hair.  Past Medical History: Past Medical History:  Diagnosis Date  . Hyperlipidemia   . Hypertension      Past Surgical History: History reviewed. No pertinent surgical history.  Home Medications: Prior to Admission medications   Medication Sig Start Date End Date Taking? Authorizing Provider  aspirin 325 MG tablet Take 325 mg by mouth daily.   Yes [provider]  hydrochlorothiazide (HYDRODIURIL) 25 MG tablet Take 1 tablet by mouth once daily 10/15/19  Yes Newlin, Enobong, MD  Multiple Vitamin (MULTIVITAMIN) tablet Take 1 tablet by mouth daily.   Yes [provider]    Allergies: No Known Allergies  Social History:  reports that she has never smoked. She has never used smokeless tobacco. She reports current alcohol use. She reports current drug use. Frequency: 3.00 times per week. Drug: Marijuana.   Family History: Family History  Problem Relation Age of Onset  . Hypertension Mother   . Hypertension Brother     Review of Systems: Review of Systems  Constitutional: Negative for chills and fever.  HENT: Negative for hearing loss.   Eyes: Negative for blurred vision.  Respiratory: Negative for shortness of breath.    Cardiovascular: Negative for chest pain.  Gastrointestinal: Negative for nausea and vomiting.  Genitourinary: Negative for dysuria.  Musculoskeletal: Negative for myalgias.  Skin: Negative for rash.  Neurological: Negative for dizziness.  Psychiatric/Behavioral: Negative for depression.    Physical Exam BP (!) 169/114   Pulse 98   Temp 98.1 F (36.7 C)   Resp 12   Ht 5\' 7"  (1.702 m)   Wt 235 lb 6.4 oz (106.8 kg)   SpO2 96%   BMI 36.87 kg/m  CONSTITUTIONAL: No acute distress HEENT:  Normocephalic, atraumatic, extraocular motion intact. NECK: Trachea is midline, and there is no jugular venous distension.  RESPIRATORY:  Lungs are clear, and breath sounds are equal bilaterally. Normal respiratory effort without pathologic use of accessory muscles. CARDIOVASCULAR: Heart is regular without murmurs, gallops, or rubs. GI: The abdomen is soft, non-distended, non-tender. MUSCULOSKELETAL:  Normal muscle strength and tone in all four extremities.  No peripheral edema or cyanosis. SKIN: Patient has 2-3 cm mass at the middle of the forehead.  It is soft, non-tender, mobile, consistent with lipoma. NEUROLOGIC:  Motor and sensation is grossly normal.  Cranial nerves are grossly intact. PSYCH:  Alert and oriented to person, place and time. Affect is normal.  Laboratory Analysis: No results found for this or any previous visit (from the past 24 hour(s)).  Imaging: No results found.  Assessment and Plan: This is a 54 y.o. female with a forehead lipoma.  Patient will be scheduled for in-office procedure to  excise the lipoma.  Discussed with her the procedure at length and that we would send the mass to pathology for analysis.  Gave the patient the option for doing this in the operating room under sedation instead, given the location, but the patient is comfortable doing it in the office.  We will see her next week for the procedure.  [name], MD   Face-to-face time spent with the  patient and care providers was 40 minutes, with more than 50% of the time spent counseling, educating, and coordinating care of the patient.     Melvyn Neth, [facility]

## 2019-12-12 ENCOUNTER — Telehealth: Payer: Self-pay | Admitting: Emergency Medicine

## 2019-12-12 NOTE — Telephone Encounter (Signed)
-----   Message from [name], MD sent at 12/10/2019  9:58 PM EST ----- Regarding: procedure instructions Hi Tammy Barajas,  I overlooked the fact that this patient takes Aspirin 325 mg daily.  Can you please call the patient and ask her to stop taking her Aspirin?  I'll give her instructions when to resume it when I see her for the procedure.  Thanks!  [employer]

## 2019-12-12 NOTE — Telephone Encounter (Signed)
Called pt to stop taking Aspirin for now until she comes in on Monday for her procedure. Pt made aware and verbalized understanding.

## 2019-12-15 ENCOUNTER — Other Ambulatory Visit: Payer: Self-pay

## 2019-12-15 ENCOUNTER — Encounter: Payer: Self-pay | Admitting: Surgery

## 2019-12-15 ENCOUNTER — Ambulatory Visit: Payer: Self-pay | Admitting: Surgery

## 2019-12-15 VITALS — BP 204/132 | HR 97 | Temp 96.1°F | Resp 14 | Ht 67.0 in | Wt 232.6 lb

## 2019-12-15 NOTE — Patient Instructions (Signed)
Excision of Skin Lesions, Care After This sheet gives you information about how to care for yourself after your procedure. Your health care provider may also give you more specific instructions. If you have problems or questions, contact your health care provider. What can I expect after the procedure? After your procedure, it is common to have pain or discomfort at the excision site. Follow these instructions at home: Excision care   Follow instructions from your health care provider about how to take care of your excision site. Make sure you: ? Wash your hands with soap and water before and after you change your bandage (dressing). If soap and water are not available, use hand sanitizer. ? Change your dressing as told by your health care provider. ? Leave stitches (sutures), skin glue, or adhesive strips in place. These skin closures may need to stay in place for 2 weeks or longer. If adhesive strip edges start to loosen and curl up, you may trim the loose edges. Do not remove adhesive strips completely unless your health care provider tells you to do that.  Check the excision area every day for signs of infection. Watch for: ? Redness, swelling, or pain. ? Fluid or blood. ? Warmth. ? Pus or a bad smell.  Keep the site clean, dry, and protected for at least 48 hours.  For bleeding, apply gentle but firm pressure to the area using a folded towel for 20 minutes.  Avoid high-impact exercise and activities until the sutures are removed or the area heals. General instructions  Take over-the-counter and prescription medicines only as told by your health care provider.  Follow instructions from your health care provider about how to minimize scarring. Scarring should lessen over time.  Avoid sun exposure until the area has healed. Use sunscreen to protect the area from the sun after it has healed.  Keep all follow-up visits as told by your health care provider. This is important. Contact  a health care provider if:  You have redness, swelling, or pain around your excision site.  You have fluid or blood coming from your excision site.  Your excision site feels warm to the touch.  You have pus or a bad smell coming from your excision site.  You have a fever.  You have pain that does not improve in 2-3 days after your procedure.  You notice skin irregularities or changes in how you feel (sensation). Summary  This sheet of instructions provides you with information about caring for yourself after your procedure. Contact your health care provider if you have any problems or questions.  Take over-the-counter and prescription medicines only as told by your health care provider.  Change your dressing as told by your health care provider.  Contact a health care provider if you have redness, swelling, pain, or other signs of infection around your excision site.  Keep all follow-up visits as told by your health care provider. This is important. This information is not intended to replace advice given to you by your health care provider. Make sure you discuss any questions you have with your health care provider. Document Revised: 04/17/2018 Document Reviewed: 04/17/2018 Elsevier Patient Education  2020 Elsevier Inc.  

## 2019-12-17 ENCOUNTER — Other Ambulatory Visit: Payer: Self-pay

## 2019-12-17 ENCOUNTER — Encounter: Payer: Self-pay | Admitting: Nurse Practitioner

## 2019-12-17 ENCOUNTER — Encounter: Payer: Self-pay | Admitting: Surgery

## 2019-12-17 ENCOUNTER — Ambulatory Visit (INDEPENDENT_AMBULATORY_CARE_PROVIDER_SITE_OTHER): Payer: Self-pay | Admitting: Surgery

## 2019-12-17 VITALS — BP 199/113 | HR 90 | Temp 98.1°F | Resp 13 | Ht 67.0 in | Wt 232.0 lb

## 2019-12-17 DIAGNOSIS — D170 Benign lipomatous neoplasm of skin and subcutaneous tissue of head, face and neck: Secondary | ICD-10-CM

## 2019-12-17 NOTE — Patient Instructions (Addendum)
Procedure not done due to elevated blood pressure.  We have sent a message to your primary care provider about this. You should contact them to be seen regarding your elevated blood pressure.    We will see you back in one month and attempt the procedure then.

## 2019-12-18 ENCOUNTER — Other Ambulatory Visit: Payer: Self-pay | Admitting: Nurse Practitioner

## 2019-12-18 DIAGNOSIS — I10 Essential (primary) hypertension: Secondary | ICD-10-CM

## 2019-12-18 MED ORDER — HYDROCHLOROTHIAZIDE 25 MG PO TABS: 25.0000 mg | ORAL_TABLET | Freq: Every day | ORAL | 0 refills | Status: DC

## 2019-12-18 MED ORDER — AMLODIPINE BESYLATE 10 MG PO TABS: 10.0000 mg | ORAL_TABLET | Freq: Every day | ORAL | 3 refills | Status: DC

## 2019-12-31 ENCOUNTER — Other Ambulatory Visit: Payer: Self-pay

## 2019-12-31 ENCOUNTER — Ambulatory Visit: Payer: No Typology Code available for payment source | Attending: Nurse Practitioner | Admitting: Pharmacist

## 2019-12-31 VITALS — BP 153/106 | HR 99

## 2019-12-31 DIAGNOSIS — I10 Essential (primary) hypertension: Secondary | ICD-10-CM

## 2019-12-31 NOTE — Progress Notes (Signed)
   S:    PCP: Zelda   Patient arrives in good spirits. Presents to the clinic for hypertension evaluation, counseling, and management.  Patient was referred and last seen by Primary Care Provider on 12/17/19. I have seen her in the past and she has a history of multiple anti-HTN medication intolerances. She desires to have lipoma resection surgery and her BP must come down to be cleared to do so. She agreed to start taking amlodipine 10 mg along with her HCTZ with her PCP on 12/18/19.  Patient reports adherence with medications.  Denies HA, chest pain, dyspnea, or blurred vision.   Current BP Medications include: HCTZ 25 mg daily, Amlodipine 10 mg daily  Dietary habits include: reports limiting salt; denies drinking caffeine  Exercise habits include: patient walks daily  Family / Social history:  - FHx: HTN (mother, brother) - Tobacco: never smoker - Alcohol: occasionally - Illicit substances: smokes marijuana occasionally   O:  Vitals:   12/31/19 1701  BP: (!) 153/106  Pulse: 99   Home BP readings: checks 3 times a day. Reports a range of 130s-150s/80-100s.   Last 3 Office BP readings: BP Readings from Last 3 Encounters:  12/31/19 (!) 153/106  12/17/19 (!) 199/113  12/15/19 (!) 204/132   BMET    Component Value Date/Time   NA 139 12/04/2019 1110   K 4.0 12/04/2019 1110   CL 99 12/04/2019 1110   CO2 22 12/04/2019 1110   GLUCOSE 108 (H) 12/04/2019 1110   GLUCOSE 92 05/10/2015 1319   BUN 13 12/04/2019 1110   CREATININE 1.13 (H) 12/04/2019 1110   CREATININE 1.07 05/10/2015 1319   CALCIUM 9.9 12/04/2019 1110   GFRNONAA 56 (L) 12/04/2019 1110   GFRNONAA 71 07/03/2014 1234   GFRAA 65 12/04/2019 1110   GFRAA 82 07/03/2014 1234   Renal function: CrCl cannot be calculated (Patient's most recent lab result is older than the maximum 21 days allowed.).  Clinical ASCVD: No  The 10-year ASCVD risk score Mikey Bussing DC Jr., et al., 2013) is: 6.7%   Values used to calculate the  score:     Age: 54 years     Sex: Female     Is Non-Hispanic African American: Yes     Diabetic: No     Tobacco smoker: No     Systolic Blood Pressure: 0000000 mmHg     Is BP treated: Yes     HDL Cholesterol: 64 mg/dL     Total Cholesterol: 221 mg/dL   A/P: Hypertension longstanding currently uncontrolled on current medications. BP Goal = <130/80 mmHg. Patient is adherent with current medications. Her BP is improved based on past clinic readings.  She declines adding additional therapy at this time.  -Continued current regimen -Counseled on lifestyle modifications for blood pressure control including reduced dietary sodium, increased exercise, adequate sleep  Results reviewed and written information provided.   Total time in face-to-face counseling 30 minutes.   F/U with PCP.   Benard Halsted, PharmD, [place] [phone]

## 2020-01-14 ENCOUNTER — Ambulatory Visit (INDEPENDENT_AMBULATORY_CARE_PROVIDER_SITE_OTHER): Payer: Self-pay | Admitting: Surgery

## 2020-01-14 ENCOUNTER — Encounter: Payer: Self-pay | Admitting: Surgery

## 2020-01-14 ENCOUNTER — Other Ambulatory Visit: Payer: Self-pay

## 2020-01-14 VITALS — BP 180/117 | HR 112 | Temp 97.7°F | Resp 14 | Ht 67.0 in | Wt 234.0 lb

## 2020-01-14 DIAGNOSIS — D170 Benign lipomatous neoplasm of skin and subcutaneous tissue of head, face and neck: Secondary | ICD-10-CM

## 2020-01-14 NOTE — Patient Instructions (Signed)
Your blood pressure readings today were  197/117(machine), 199/135(machine), 192/116(manual)

## 2020-01-14 NOTE — Progress Notes (Signed)
01/14/20  Patient presented today for procedure for excision of a forehead lipoma.  She was started on Norvasc at her PCP's office in addition to her HCTZ.  She took her medications today.  The patient reports that at home her BP has been [id number] systolic, but here, it is [id number] systolic upon arrival, also on repeat a while after.  She had come in a few minutes late for her appointment and I thought perhaps the stress and rush of being late was elevating her pressure.  I had her wait in the procedure room for two hours and the repeat pressure is the same.  At this point, her pressure it too high for any elective procedures, and can have more complications of bleeding afterwards.  Will reschedule patient and have advised to call her PCP's office for BP check and medication adjustment.  [name], MD

## 2020-01-28 ENCOUNTER — Ambulatory Visit (INDEPENDENT_AMBULATORY_CARE_PROVIDER_SITE_OTHER): Payer: No Typology Code available for payment source

## 2020-01-28 ENCOUNTER — Ambulatory Visit (HOSPITAL_COMMUNITY)
Admission: EM | Admit: 2020-01-28 | Discharge: 2020-01-28 | Disposition: A | Discharge status: Home / Self Care | Payer: Self-pay | Attending: Emergency Medicine | Admitting: Emergency Medicine

## 2020-01-28 ENCOUNTER — Encounter (HOSPITAL_COMMUNITY): Payer: Self-pay

## 2020-01-28 ENCOUNTER — Other Ambulatory Visit: Payer: Self-pay

## 2020-01-28 DIAGNOSIS — M25561 Pain in right knee: Secondary | ICD-10-CM

## 2020-01-28 DIAGNOSIS — M545 Low back pain, unspecified: Secondary | ICD-10-CM

## 2020-01-28 MED ORDER — CYCLOBENZAPRINE HCL 5 MG PO TABS: 5.0000 mg | ORAL_TABLET | Freq: Two times a day (BID) | ORAL | 0 refills | Status: DC | PRN

## 2020-01-28 MED ORDER — IBUPROFEN 800 MG PO TABS: 800.0000 mg | ORAL_TABLET | Freq: Three times a day (TID) | ORAL | 0 refills | Status: DC
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
## 2020-01-28 NOTE — Discharge Instructions (Signed)
Xray normal Use anti-inflammatories for pain/swelling. You may take up to 800 mg Ibuprofen every 8 hours with food. You may supplement Ibuprofen with Tylenol [phone] every 8 hours.   You may use flexeril as needed to help with pain. This is a muscle relaxer and causes sedation- please use only at bedtime or when you will be home and not have to drive/work  Gentle stretching, ice and heat  Follow up if not improving
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
## 2020-02-02 ENCOUNTER — Other Ambulatory Visit: Payer: Self-pay

## 2020-02-02 ENCOUNTER — Other Ambulatory Visit: Payer: Self-pay | Admitting: Surgery

## 2020-02-02 ENCOUNTER — Ambulatory Visit (INDEPENDENT_AMBULATORY_CARE_PROVIDER_SITE_OTHER): Payer: Self-pay | Admitting: Surgery

## 2020-02-02 ENCOUNTER — Encounter: Payer: Self-pay | Admitting: Surgery

## 2020-02-02 VITALS — BP 180/107 | HR 90 | Temp 93.6°F | Resp 12 | Ht 67.0 in | Wt 235.6 lb

## 2020-02-02 DIAGNOSIS — D170 Benign lipomatous neoplasm of skin and subcutaneous tissue of head, face and neck: Secondary | ICD-10-CM

## 2020-02-02 NOTE — Progress Notes (Signed)
  Procedure Date:  02/02/2020  Pre-operative Diagnosis:  Forehead Lipoma  Post-operative Diagnosis:  Forehead lipoma  Procedure:  Excision of forehead lipoma with layered wound closure.  Surgeon:  Melvyn Neth, MD  Assistant:  Dalene Seltzer, PA-S  Anesthesia:  8 ml 1% lidocaine with epi  Estimated Blood Loss:  5 ml  Specimens:  Forehead lipoma  Complications:  None  Indications for Procedure:  This is a 54 y.o. female with diagnosis of a symptomatic forehead lipoma.  The patient wishes to have this excised. The risks of bleeding, abscess or infection, injury to surrounding structures, and need for further procedures were all discussed with the patient and she was willing to proceed.  Description of Procedure: The patient was correctly identified at bedside.  The patient was placed supine.  Appropriate time-outs were performed.   The patient's forehead was prepped and draped in usual sterile fashion.  Local anesthetic was infused intradermally.  A 3.5 cm incision was made over the lipoma, and scalpel was used to dissect down the subcutaneous tissue to the lipoma itself.  Skin flaps were created using scalpel as well, and then the lipoma was excised, intact.  It was sent off to pathology.  The cavity was then irrigated and hemostasis was assured with manual pressure.  The wound was then closed in two layers using 3-0 Vicryl and 4-0 Monocryl.  The incision was cleaned and sealed with DermaBond.  The patient tolerated the procedure well and all sharps were appropriately disposed of at the end of the case.  --Patient will follow up in 10 days for wound check --May use Tylenol or Ibuprofen for pain control   Melvyn Neth, MD

## 2020-02-02 NOTE — Patient Instructions (Signed)
Today we have removed a Lipoma in our office.   You are free to shower tomorrow.   You have glue on your skin and sutures under the skin. The glue will come off on it's own in 10-14 days. You may shower normally until this occurs but do not submerge.  Please use Tylenol or Ibuprofen for pain as needed. You may use an ice pack to help with swelling. Do not apply any ointments or creams to the incision site.   We will see you back for a follow up to make sure your incision is looking good. Please call our office with any questions or concerns prior to your appointment.   Lipoma Removal Lipoma removal is a surgical procedure to remove a noncancerous (benign) tumor that is made up of fat cells (lipoma). Most lipomas are small and painless and do not require treatment. They can form in many areas of the body but are most common under the skin of the back, shoulders, arms, and thighs. You may need lipoma removal if you have a lipoma that is large, growing, or causing discomfort. Lipoma removal may also be done for cosmetic reasons. Tell a health care provider about:  Any allergies you have.  All medicines you are taking, including vitamins, herbs, eye drops, creams, and over-the-counter medicines.  Any problems you or family members have had with anesthetic medicines.  Any blood disorders you have.  Any surgeries you have had.  Any medical conditions you have.  Whether you are pregnant or may be pregnant. What are the risks? Generally, this is a safe procedure. However, problems may occur, including:  Infection.  Bleeding.  Allergic reactions to medicines.  Damage to nerves or blood vessels near the lipoma.  Scarring.  What happens before the procedure? Staying hydrated Follow instructions from your health care provider about hydration, which may include:  Up to 2 hours before the procedure - you may continue to drink clear liquids, such as water, clear fruit juice, black  coffee, and plain tea.  Eating and drinking restrictions Follow instructions from your health care provider about eating and drinking, which may include:  8 hours before the procedure - stop eating heavy meals or foods such as meat, fried foods, or fatty foods.  6 hours before the procedure - stop eating light meals or foods, such as toast or cereal.  6 hours before the procedure - stop drinking milk or drinks that contain milk.  2 hours before the procedure - stop drinking clear liquids.  Medicines  Ask your health care provider about: ? Changing or stopping your regular medicines. This is especially important if you are taking diabetes medicines or blood thinners. ? Taking medicines such as aspirin and ibuprofen. These medicines can thin your blood. Do not take these medicines before your procedure if your health care provider instructs you not to.  You may be given antibiotic medicine to help prevent infection. General instructions  Ask your health care provider how your surgical site will be marked or identified.  You will have a physical exam. Your health care provider will check the size of the lipoma and whether it can be moved easily.  You may have imaging tests, such as: ? X-rays. ? CT scan. ? MRI.  Plan to have someone take you home from the hospital or clinic. What happens during the procedure?  To reduce your risk of infection: ? Your health care team will wash or sanitize their hands. ? Your skin will  be washed with soap.  You will be given one or more of the following: ? A medicine to help you relax (sedative). ? A medicine to numb the area (local anesthetic). ? A medicine to make you fall asleep (general anesthetic). ? A medicine that is injected into an area of your body to numb everything below the injection site (regional anesthetic).  An incision will be made over the lipoma or very near the lipoma. The incision may be made in a natural skin line or  crease.  Tissues, nerves, and blood vessels near the lipoma will be moved out of the way.  The lipoma and the capsule that surrounds it will be separated from the surrounding tissues.  The lipoma will be removed.  The incision may be closed with stitches (sutures).  A bandage (dressing) will be placed over the incision. What happens after the procedure?  Do not drive for 24 hours if you received a sedative.  Your blood pressure, heart rate, breathing rate, and blood oxygen level will be monitored until the medicines you were given have worn off. This information is not intended to replace advice given to you by your health care provider. Make sure you discuss any questions you have with your health care provider. Document Released: 12/23/2015 Document Revised: 03/16/2016 Document Reviewed: 12/23/2015 Elsevier Interactive Patient Education  [employer].

## 2020-02-04 ENCOUNTER — Ambulatory Visit: Payer: Self-pay | Admitting: Surgery

## 2020-02-05 NOTE — Progress Notes (Signed)
02/05/20  Pathology results -- lipoma.  Results released to patient via MyChart.  [name], MD

## 2020-02-11 ENCOUNTER — Encounter: Payer: Self-pay | Admitting: Surgery

## 2020-02-11 ENCOUNTER — Other Ambulatory Visit: Payer: Self-pay

## 2020-02-11 ENCOUNTER — Ambulatory Visit (INDEPENDENT_AMBULATORY_CARE_PROVIDER_SITE_OTHER): Payer: Self-pay | Admitting: Surgery

## 2020-02-11 VITALS — BP 162/99 | HR 84 | Temp 97.2°F | Resp 14 | Ht 67.0 in | Wt 235.0 lb

## 2020-02-11 DIAGNOSIS — Z09 Encounter for follow-up examination after completed treatment for conditions other than malignant neoplasm: Secondary | ICD-10-CM

## 2020-02-11 DIAGNOSIS — D170 Benign lipomatous neoplasm of skin and subcutaneous tissue of head, face and neck: Secondary | ICD-10-CM

## 2020-02-11 NOTE — Progress Notes (Signed)
02/11/2020  HPI: Tammy Barajas is a 54 y.o. female s/p excision of forehead lipoma on 02/02/20.  Pathology resulted in lipoma.  Patient is doing well, denies any worsening pain, issues with the incision.  She uses ice packs to help with the swelling.  Vital signs: BP (!) 162/99   Pulse 84   Temp (!) 97.2 F (36.2 C)   Resp 14   Ht 5\' 7"  (1.702 m)   Wt 235 lb (106.6 kg)   BMI 36.81 kg/m    Physical Exam: Constitutional:  No acute distress Skin:  Forehead incision is healing well, clean, dry, intact, with dermabond.  No drainage or erythema.  Assessment/Plan: This is a 54 y.o. female s/p excision of forehead lipoma.  --patient is healing well.  No complications --may remove dermabond if it has not peeled off in one week.  May then start applying vitamin E ointment to help fade the scar better. --follow up prn.   Melvyn Neth, [facility]

## 2020-02-11 NOTE — Patient Instructions (Addendum)
Follow-up with our office as needed.  Please call and ask to speak with a nurse if you develop questions or concerns.  GENERAL POST-OPERATIVE PATIENT INSTRUCTIONS   WOUND CARE INSTRUCTIONS:  Keep a dry clean dressing on the wound if there is drainage. The initial bandage may be removed after 24 hours.  Once the wound has quit draining you may leave it open to air.  If clothing rubs against the wound or causes irritation and the wound is not draining you may cover it with a dry dressing during the daytime.  Try to keep the wound dry and avoid ointments on the wound unless directed to do so.  If the wound becomes bright red and painful or starts to drain infected material that is not clear, please contact your physician immediately.  If the wound is mildly pink and has a thick firm ridge underneath it, this is normal, and is referred to as a healing ridge.  This will resolve over the next 4-6 weeks.

## 2020-04-22 ENCOUNTER — Other Ambulatory Visit: Payer: Self-pay | Admitting: Nurse Practitioner

## 2020-04-22 DIAGNOSIS — I10 Essential (primary) hypertension: Secondary | ICD-10-CM

## 2020-07-13 ENCOUNTER — Telehealth: Payer: Self-pay | Admitting: Nurse Practitioner

## 2020-07-13 NOTE — Telephone Encounter (Signed)
Copied from [place] [id number]. Topic: Referral - Request for Referral >> Jul 13, 2020  3:41 PM Rainey Pines A wrote: Has patient seen PCP for this complaint? yes *If NO, is insurance requiring patient see PCP for this issue before PCP can refer them? Referral for which specialty: ortho Preferred provider/office: No preference Reason for referral: Knee pain and swelling

## 2020-07-14 NOTE — Telephone Encounter (Signed)
Please call and ask the following questions:    Which Knee?  How Long?  Recent Injury?   Any symptoms aside from pain and swelling?   SHe also needs a HTN appt. THANKS!!!

## 2020-07-15 ENCOUNTER — Other Ambulatory Visit: Payer: Self-pay | Admitting: Nurse Practitioner

## 2020-07-15 ENCOUNTER — Telehealth (INDEPENDENT_AMBULATORY_CARE_PROVIDER_SITE_OTHER): Payer: Self-pay | Admitting: Nurse Practitioner

## 2020-07-15 DIAGNOSIS — G8929 Other chronic pain: Secondary | ICD-10-CM

## 2020-07-15 MED ORDER — IBUPROFEN 600 MG PO TABS: 600.0000 mg | ORAL_TABLET | Freq: Three times a day (TID) | ORAL | 1 refills | Status: AC

## 2020-07-15 NOTE — Telephone Encounter (Signed)
CMA attempt to reach patient regarding on her knee pain. No answer and LVM for call back.

## 2020-07-15 NOTE — Telephone Encounter (Signed)
Copied from [place] [id number]. Topic: Appointment Scheduling - Scheduling Inquiry for Clinic >> Jul 15, 2020 11:48 AM Scherrie Gerlach wrote: Reason for CRM: pt has appt 11/02.  But pt states she needs sooner appt, Zelda needs to look at her knee for referral.  Also, pt states she should have come back in May, and never heard from the office. Please advise if pt can have earlier appt.

## 2020-07-15 NOTE — Telephone Encounter (Signed)
I sent ibuprofen for her knee and xray has been ordered.
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
## 2020-07-16 NOTE — Telephone Encounter (Signed)
Spoke to patient and informed that  - Rx sent to pharmacy - XR ordered- Preferred to go to [facility]

## 2020-08-24 ENCOUNTER — Ambulatory Visit: Payer: No Typology Code available for payment source | Attending: Nurse Practitioner | Admitting: Nurse Practitioner

## 2020-08-24 ENCOUNTER — Other Ambulatory Visit: Payer: Self-pay

## 2020-08-24 ENCOUNTER — Encounter: Payer: Self-pay | Admitting: Nurse Practitioner

## 2020-08-24 VITALS — BP 180/109 | HR 102 | Temp 97.7°F | Ht 67.0 in | Wt 234.0 lb

## 2020-08-24 DIAGNOSIS — Z1159 Encounter for screening for other viral diseases: Secondary | ICD-10-CM

## 2020-08-24 DIAGNOSIS — Z1231 Encounter for screening mammogram for malignant neoplasm of breast: Secondary | ICD-10-CM

## 2020-08-24 DIAGNOSIS — I10 Essential (primary) hypertension: Secondary | ICD-10-CM

## 2020-08-24 DIAGNOSIS — Z1211 Encounter for screening for malignant neoplasm of colon: Secondary | ICD-10-CM

## 2020-08-24 NOTE — Progress Notes (Signed)
Assessment & Plan:  Tammy Barajas was seen today for hypertension.  Diagnoses and all orders for this visit:  Essential hypertension -     CMP14+EGFR Continue all antihypertensives as prescribed.  Remember to bring in your blood pressure log with you for your follow up appointment.  DASH/Mediterranean Diets are healthier choices for HTN.    Colon cancer screening -     Fecal occult blood, imunochemical([facility])  Need for hepatitis C screening test -     Hepatitis C Antibody  Breast cancer screening by mammogram -     MM 3D SCREEN BREAST BILATERAL; Future Referred to [facility]  Patient has been counseled on age-appropriate routine health concerns for screening and prevention. These are reviewed and up-to-date. Referrals have been placed accordingly. Immunizations are up-to-date or declined.    Subjective:   Chief Complaint  Patient presents with   Hypertension    Pt. is here for blood pressure follow up.    HPI Tammy Barajas 54 y.o. female presents to office today for HTN.   Essential Hypertension Very elevated today. She states her birthday was 2 weeks ago and she also attended homecoming activities this weekend so she has been celebrating and not staying on her diet. She is currently taking HCTZ 25 mg and amlodipine 10 mg daily. Denies chest pain, shortness of breath, palpitations, lightheadedness, dizziness, headaches or BLE edema. Declined clonidine in office.  BP Readings from Last 3 Encounters:  08/24/20 (!) 180/109  02/11/20 (!) 162/99  02/02/20 (!) 180/107    Review of Systems  Constitutional: Negative for fever, malaise/fatigue and weight loss.  HENT: Negative.  Negative for nosebleeds.   Eyes: Negative.  Negative for blurred vision, double vision and photophobia.  Respiratory: Negative.  Negative for cough and shortness of breath.   Cardiovascular: Negative.  Negative for chest pain, palpitations and leg swelling.  Gastrointestinal: Negative.  Negative  for heartburn, nausea and vomiting.  Musculoskeletal: Negative.  Negative for myalgias.  Neurological: Negative.  Negative for dizziness, focal weakness, seizures and headaches.  Psychiatric/Behavioral: Negative.  Negative for suicidal ideas.    Past Medical History:  Diagnosis Date   Hyperlipidemia    Hypertension     History reviewed. No pertinent surgical history.  Family History  Problem Relation Age of Onset   Hypertension Mother    Hypertension Brother    Healthy Father     Social History Reviewed with no changes to be made today.   Outpatient Medications Prior to Visit  Medication Sig Dispense Refill   amLODipine (NORVASC) 10 MG tablet Take 1 tablet (10 mg total) by mouth daily. 90 tablet 3   Cholecalciferol (VITAMIN D3 PO) Take by mouth.     hydrochlorothiazide (HYDRODIURIL) 25 MG tablet Take 1 tablet by mouth once daily 90 tablet 0   Multiple Vitamin (MULTIVITAMIN) tablet Take 1 tablet by mouth daily.     cyclobenzaprine (FLEXERIL) 5 MG tablet Take 1-2 tablets (5-10 mg total) by mouth 2 (two) times daily as needed for muscle spasms. (Patient not taking: Reported on 08/24/2020) 24 tablet 0   metroNIDAZOLE (FLAGYL) 500 MG tablet Take 500 mg by mouth 2 (two) times daily. (Patient not taking: Reported on 08/24/2020)     No facility-administered medications prior to visit.    No Known Allergies     Objective:    BP (!) 180/109 (BP Location: Right Arm, Patient Position: Sitting, Cuff Size: Large)    Pulse (!) 102    Temp 97.7 F (36.5 C) (Temporal)  Ht 5' 7"  (1.702 m)    Wt 234 lb (106.1 kg)    SpO2 100%    BMI 36.65 kg/m  Wt Readings from Last 3 Encounters:  08/24/20 234 lb (106.1 kg)  02/11/20 235 lb (106.6 kg)  02/02/20 235 lb 9.6 oz (106.9 kg)    Physical Exam Vitals and nursing note reviewed.  Constitutional:      Appearance: She is well-developed.  HENT:     Head: Normocephalic and atraumatic.  Cardiovascular:     Rate and Rhythm: Normal  rate and regular rhythm.     Heart sounds: Normal heart sounds. No murmur heard.  No friction rub. No gallop.   Pulmonary:     Effort: Pulmonary effort is normal. No tachypnea or respiratory distress.     Breath sounds: Normal breath sounds. No decreased breath sounds, wheezing, rhonchi or rales.  Chest:     Chest wall: No tenderness.  Abdominal:     General: Bowel sounds are normal.     Palpations: Abdomen is soft.  Musculoskeletal:        General: Normal range of motion.     Cervical back: Normal range of motion.  Skin:    General: Skin is warm and dry.  Neurological:     Mental Status: She is alert and oriented to person, place, and time.     Coordination: Coordination normal.  Psychiatric:        Behavior: Behavior normal. Behavior is cooperative.        Thought Content: Thought content normal.        Judgment: Judgment normal.          Patient has been counseled extensively about nutrition and exercise as well as the importance of adherence with medications and regular follow-up. The patient was given clear instructions to go to ER or return to medical center if symptoms don't improve, worsen or new problems develop. The patient verbalized understanding.   Follow-up: Return in about 4 weeks (around 09/21/2020) for BP CHECK WITH LUKE then see me in 3 months.   [name], FNP-BC [facility] and Rossie Cabery, [place]   08/24/2020, 5:55 PM

## 2020-08-25 LAB — CMP14+EGFR
ALT: 21 IU/L (ref 0–32)
AST: 20 IU/L (ref 0–40)
Albumin/Globulin Ratio: 1.3 (ref 1.2–2.2)
Albumin: 4.4 g/dL (ref 3.8–4.9)
Alkaline Phosphatase: 72 IU/L (ref 44–121)
BUN/Creatinine Ratio: 18 (ref 9–23)
BUN: 18 mg/dL (ref 6–24)
Bilirubin Total: 0.5 mg/dL (ref 0.0–1.2)
CO2: 23 mmol/L (ref 20–29)
Calcium: 9.6 mg/dL (ref 8.7–10.2)
Chloride: 101 mmol/L (ref 96–106)
Creatinine, Ser: 0.99 mg/dL (ref 0.57–1.00)
GFR calc Af Amer: 76 mL/min/{1.73_m2} (ref >59)
GFR calc non Af Amer: 66 mL/min/{1.73_m2} (ref >59)
Globulin, Total: 3.5 g/dL (ref 1.5–4.5)
Glucose: 79 mg/dL (ref 65–99)
Potassium: 3.7 mmol/L (ref 3.5–5.2)
Sodium: 138 mmol/L (ref 134–144)
Total Protein: 7.9 g/dL (ref 6.0–8.5)

## 2020-08-25 LAB — HEPATITIS C ANTIBODY: Hep C Virus Ab: 0.1 s/co ratio (ref 0.0–0.9)

## 2020-09-09 ENCOUNTER — Other Ambulatory Visit: Payer: Self-pay | Admitting: Nurse Practitioner

## 2020-09-09 DIAGNOSIS — I10 Essential (primary) hypertension: Secondary | ICD-10-CM

## 2020-09-21 ENCOUNTER — Ambulatory Visit: Payer: Self-pay | Attending: Nurse Practitioner | Admitting: Pharmacist

## 2020-09-21 ENCOUNTER — Encounter: Payer: Self-pay | Admitting: Pharmacist

## 2020-09-21 ENCOUNTER — Other Ambulatory Visit: Payer: Self-pay

## 2020-09-21 VITALS — BP 150/90 | HR 101

## 2020-09-21 DIAGNOSIS — I10 Essential (primary) hypertension: Secondary | ICD-10-CM

## 2020-09-21 NOTE — Progress Notes (Signed)
   S:    PCP: Tammy Barajas   Patient arrives in good spirits. Presents to the clinic for hypertension evaluation, counseling, and management.  Patient was referred and last seen by Primary Care Provider on 08/24/2020.   Patient reports adherence with medications.  Denies HA, chest pain, dyspnea, or blurred vision.   Current BP Medications include: HCTZ 25 mg daily, Amlodipine 10 mg daily  Dietary habits include: reports limiting salt; denies drinking caffeine  Exercise habits include: patient walks daily  Family / Social history:  - FHx: HTN (mother, brother) - Tobacco: never smoker - Alcohol: occasionally - Illicit substances: smokes marijuana occasionally   O:  Vitals:   09/21/20 1602  BP: (!) 150/90  Pulse: (!) 101   Home BP readings: checks 3 times a day. Reports a range of 130s-150s/80-100s.   Last 3 Office BP readings: BP Readings from Last 3 Encounters:  09/21/20 (!) 150/90  08/24/20 (!) 180/109  02/11/20 (!) 162/99   BMET    Component Value Date/Time   NA 138 08/24/2020 1710   K 3.7 08/24/2020 1710   CL 101 08/24/2020 1710   CO2 23 08/24/2020 1710   GLUCOSE 79 08/24/2020 1710   GLUCOSE 92 05/10/2015 1319   BUN 18 08/24/2020 1710   CREATININE 0.99 08/24/2020 1710   CREATININE 1.07 05/10/2015 1319   CALCIUM 9.6 08/24/2020 1710   GFRNONAA 66 08/24/2020 1710   GFRNONAA 71 07/03/2014 1234   GFRAA 76 08/24/2020 1710   GFRAA 82 07/03/2014 1234   Renal function: CrCl cannot be calculated (Patient's most recent lab result is older than the maximum 21 days allowed.).  Clinical ASCVD: No  The 10-year ASCVD risk score Mikey Bussing DC Jr., et al., 2013) is: 6.6%   Values used to calculate the score:     Age: 54 years     Sex: Female     Is Non-Hispanic African American: Yes     Diabetic: No     Tobacco smoker: No     Systolic Blood Pressure: 951 mmHg     Is BP treated: Yes     HDL Cholesterol: 64 mg/dL     Total Cholesterol: 221 mg/dL   A/P: Hypertension  longstanding currently uncontrolled on current medications. BP Goal = <130/80 mmHg. Patient is adherent with current medications. She declines adding additional therapy at this time.  -Continued current regimen -Counseled on lifestyle modifications for blood pressure control including reduced dietary sodium, increased exercise, adequate sleep  Results reviewed and written information provided. Total time in face-to-face counseling 30 minutes.   F/U with PCP.   Benard Halsted, PharmD, [place] [phone]

## 2020-10-28 ENCOUNTER — Ambulatory Visit
Admission: RE | Admit: 2020-10-28 | Discharge: 2020-10-28 | Disposition: A | Discharge status: Home / Self Care | Payer: No Typology Code available for payment source | Source: Ambulatory Visit | Attending: Nurse Practitioner | Admitting: Nurse Practitioner

## 2020-10-28 DIAGNOSIS — Z1231 Encounter for screening mammogram for malignant neoplasm of breast: Secondary | ICD-10-CM

## 2020-11-24 ENCOUNTER — Ambulatory Visit: Payer: No Typology Code available for payment source | Admitting: Nurse Practitioner

## 2020-11-26 ENCOUNTER — Telehealth: Payer: Self-pay | Admitting: Nurse Practitioner

## 2020-11-26 NOTE — Telephone Encounter (Signed)
Patient is calling to see if Tammy Barajas can place a referral for an xray regarding her knee pain. Please advise

## 2020-11-26 NOTE — Telephone Encounter (Signed)
Called patient and LVM attempting to get patient appointment from 11/24/20 rescheduled. Advised patient to call [phone] to schedule.

## 2020-11-30 NOTE — Telephone Encounter (Signed)
Spoke to patient and informed that she can go to [facility] for her Xray, no appt. Needed.  Patient understood.

## 2020-12-09 ENCOUNTER — Other Ambulatory Visit: Payer: Self-pay | Admitting: Nurse Practitioner

## 2020-12-09 DIAGNOSIS — I10 Essential (primary) hypertension: Secondary | ICD-10-CM

## 2020-12-29 ENCOUNTER — Other Ambulatory Visit: Payer: Self-pay | Admitting: Nurse Practitioner

## 2020-12-29 NOTE — Telephone Encounter (Signed)
Requested Prescriptions  Pending Prescriptions Disp Refills  . amLODipine (NORVASC) 10 MG tablet [Pharmacy Med Name: amLODIPine Besylate 10 MG Oral Tablet] 90 tablet 0    Sig: Take 1 tablet by mouth once daily     Cardiovascular:  Calcium Channel Blockers Failed - 12/29/2020  3:01 PM      Failed - Last BP in normal range    BP Readings from Last 1 Encounters:  09/21/20 (!) 150/90         Passed - Valid encounter within last 6 months    Recent Outpatient Visits          3 months ago Essential hypertension   [place], Jarome Matin, RPH-CPP   4 months ago Essential hypertension   [place], Zelda W, NP   12 months ago Essential hypertension   [place], [place], RPH-CPP   1 year ago Essential hypertension   [place], Vernia Buff, NP   1 year ago Encounter for Papanicolaou smear for cervical cancer screening   [place], Vernia Buff, NP      Future Appointments            In 1 week [name], NP [place]

## 2021-01-05 ENCOUNTER — Telehealth: Payer: Self-pay | Admitting: Nurse Practitioner

## 2021-01-05 NOTE — Telephone Encounter (Signed)
Called Pt no answer. Left vm that appt on  3/22 is virtual. Provider will be out the office but working virtual. If in-person is preferred call [phone] to reschedule.

## 2021-01-06 ENCOUNTER — Telehealth: Payer: Self-pay | Admitting: Nurse Practitioner
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
## 2021-01-06 NOTE — Telephone Encounter (Signed)
Need xray order for knee.

## 2021-01-08 NOTE — Telephone Encounter (Signed)
Xray was ordered on 07-15-2020

## 2021-01-10 NOTE — Telephone Encounter (Signed)
Pt was called and a Vm was left informing her to return phone call.   CMA would like to know where pt went to get X-ray done.

## 2021-01-11 ENCOUNTER — Other Ambulatory Visit: Payer: Self-pay

## 2021-01-11 ENCOUNTER — Ambulatory Visit: Payer: Self-pay | Attending: Nurse Practitioner | Admitting: Nurse Practitioner

## 2021-01-11 ENCOUNTER — Encounter: Payer: Self-pay | Admitting: Nurse Practitioner

## 2021-01-11 DIAGNOSIS — G8929 Other chronic pain: Secondary | ICD-10-CM

## 2021-01-11 DIAGNOSIS — M25561 Pain in right knee: Secondary | ICD-10-CM

## 2021-01-11 DIAGNOSIS — I10 Essential (primary) hypertension: Secondary | ICD-10-CM
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
## 2021-03-15 ENCOUNTER — Other Ambulatory Visit: Payer: Self-pay | Admitting: Nurse Practitioner

## 2021-03-15 DIAGNOSIS — I10 Essential (primary) hypertension: Secondary | ICD-10-CM

## 2021-03-15 NOTE — Telephone Encounter (Signed)
Pt called about BP meds/BP check and stated that she was advised to schedule an appt with Zelda this month/ next appt opening is for 7.18.22/ please advise if an opening is available sooner for the pt

## 2021-03-15 NOTE — Telephone Encounter (Signed)
Requested Prescriptions  Pending Prescriptions Disp Refills  . hydrochlorothiazide (HYDRODIURIL) 25 MG tablet [Pharmacy Med Name: hydroCHLOROthiazide 25 MG Oral Tablet] 90 tablet 0    Sig: Take 1 tablet by mouth once daily     Cardiovascular: Diuretics - Thiazide Failed - 03/15/2021  9:32 AM      Failed - Last BP in normal range    BP Readings from Last 1 Encounters:  09/21/20 (!) 150/90         Passed - Ca in normal range and within 360 days    Calcium  Date Value Ref Range Status  08/24/2020 9.6 8.7 - 10.2 mg/dL Final         Passed - Cr in normal range and within 360 days    Creat  Date Value Ref Range Status  05/10/2015 1.07 0.50 - 1.10 mg/dL Final   Creatinine, Ser  Date Value Ref Range Status  08/24/2020 0.99 0.57 - 1.00 mg/dL Final   Creatinine,U  Date Value Ref Range Status  05/18/2009  mg/dL Final   46.3 (NOTE)  Cutoff Values for Urine Drug Screen:        Drug Class           Cutoff (ng/mL)        Amphetamines            1000        Barbiturates             200        Cocaine Metabolites      300        Benzodiazepines          200        Methadone                 300        Opiates                 2000        Phencyclidine             25        Propoxyphene             300        Marijuana Metabolites     50  For medical purposes only.         Passed - K in normal range and within 360 days    Potassium  Date Value Ref Range Status  08/24/2020 3.7 3.5 - 5.2 mmol/L Final         Passed - Na in normal range and within 360 days    Sodium  Date Value Ref Range Status  08/24/2020 138 134 - 144 mmol/L Final         Passed - Valid encounter within last 6 months    Recent Outpatient Visits          2 months ago Chronic pain of right knee   [place], Vernia Buff, NP   5 months ago Essential hypertension   [place], RPH-CPP   6 months ago Essential hypertension   [place], Vernia Buff, NP   1 year ago Essential hypertension   [place], RPH-CPP   1 year ago Essential hypertension   [place], Vernia Buff, NP

## 2021-03-17 ENCOUNTER — Other Ambulatory Visit: Payer: Self-pay | Admitting: Nurse Practitioner

## 2021-03-17 DIAGNOSIS — I10 Essential (primary) hypertension: Secondary | ICD-10-CM

## 2021-03-25 ENCOUNTER — Telehealth: Payer: Self-pay | Admitting: Nurse Practitioner

## 2021-03-25 NOTE — Telephone Encounter (Signed)
Copied from [place] [id number]. Topic: General - Other >> Mar 25, 2021 11:24 AM P[place], Brayton Layman A wrote: Reason for CRM: Pt had some follow up questions regarding her knee pain and scheduling an XRAY. Please call back

## 2021-03-28 NOTE — Telephone Encounter (Signed)
Returned pt call pt states she is needing a referral to get her xrays done. Made pt aware that she doesn't need an xray she can just go to [facility] Radiology department and get xrays done. Pt states she understands and she is going today to get them done

## 2021-03-28 NOTE — Telephone Encounter (Signed)
Pt is returning Tammy Barajas call no answer at flow  number

## 2021-03-28 NOTE — Telephone Encounter (Signed)
Returned pt call and lvm asking pt to give a call back at her earliest convenience

## 2021-04-07 ENCOUNTER — Other Ambulatory Visit: Payer: Self-pay | Admitting: Nurse Practitioner

## 2021-04-07 ENCOUNTER — Ambulatory Visit (HOSPITAL_COMMUNITY)
Admission: RE | Admit: 2021-04-07 | Discharge: 2021-04-07 | Disposition: A | Discharge status: Home / Self Care | Payer: No Typology Code available for payment source | Source: Ambulatory Visit | Attending: Nurse Practitioner | Admitting: Nurse Practitioner

## 2021-04-07 ENCOUNTER — Other Ambulatory Visit: Payer: Self-pay

## 2021-04-07 DIAGNOSIS — G8929 Other chronic pain: Secondary | ICD-10-CM | POA: Insufficient documentation

## 2021-04-07 DIAGNOSIS — M25561 Pain in right knee: Secondary | ICD-10-CM | POA: Insufficient documentation

## 2021-04-08 ENCOUNTER — Other Ambulatory Visit: Payer: Self-pay | Admitting: Nurse Practitioner

## 2021-04-09 NOTE — Telephone Encounter (Signed)
Last RF 04/07/21 #90

## 2021-04-10 ENCOUNTER — Other Ambulatory Visit: Payer: Self-pay | Admitting: Nurse Practitioner

## 2021-04-10 DIAGNOSIS — M25461 Effusion, right knee: Secondary | ICD-10-CM

## 2021-04-20 ENCOUNTER — Ambulatory Visit (INDEPENDENT_AMBULATORY_CARE_PROVIDER_SITE_OTHER): Payer: Self-pay | Admitting: Orthopedic Surgery

## 2021-04-20 ENCOUNTER — Other Ambulatory Visit: Payer: Self-pay

## 2021-04-20 DIAGNOSIS — M1711 Unilateral primary osteoarthritis, right knee: Secondary | ICD-10-CM

## 2021-04-20 DIAGNOSIS — M25461 Effusion, right knee: Secondary | ICD-10-CM

## 2021-04-24 ENCOUNTER — Encounter: Payer: Self-pay | Admitting: Orthopedic Surgery
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
## 2021-04-25 MED ORDER — LIDOCAINE HCL 1 % IJ SOLN
5.0000 mL | INTRAMUSCULAR | Status: AC | PRN
  Administered 2021-04-20: 5 mL

## 2021-04-25 MED ORDER — METHYLPREDNISOLONE ACETATE 40 MG/ML IJ SUSP
40.0000 mg | INTRAMUSCULAR | Status: AC | PRN
  Administered 2021-04-20: 40 mg via INTRA_ARTICULAR

## 2021-04-25 MED ORDER — BUPIVACAINE HCL 0.25 % IJ SOLN
4.0000 mL | INTRAMUSCULAR | Status: AC | PRN
  Administered 2021-04-20: 4 mL via INTRA_ARTICULAR

## 2021-05-17 ENCOUNTER — Other Ambulatory Visit: Payer: Self-pay

## 2021-05-17 ENCOUNTER — Ambulatory Visit: Payer: Self-pay | Attending: Nurse Practitioner | Admitting: Nurse Practitioner

## 2021-05-17 DIAGNOSIS — I10 Essential (primary) hypertension: Secondary | ICD-10-CM

## 2021-05-17 NOTE — Progress Notes (Signed)
Virtual Visit via Telephone Note Due to national recommendations of social distancing due to [place] 19, telehealth visit is felt to be most appropriate for this patient at this time.  I discussed the limitations, risks, security and privacy concerns of performing an evaluation and management service by telephone and the availability of in person appointments. I also discussed with the patient that there may be a patient responsible charge related to this service. The patient expressed understanding and agreed to proceed.    I connected with Tammy Barajas on 05/17/21  at   3:10 PM EDT  EDT by telephone and verified that I am speaking with the correct person using two identifiers.  Location of Patient: Private Residence   Location of Provider: [place] and [employer] Office    Persons participating in Telemedicine visit: Tammy Rankins FNP-BC Tammy Barajas    History of Present Illness: Telemedicine visit for: HTN She has a past medical history of Hyperlipidemia and Hypertension.   HTN She has a history of HTN not well controlled due to medication nonadherence in the past. Currently taking amlodipine 10 mg daily and HCTZ 25 mg daily as prescribed however blood pressure has not been controlled. Denies chest pain, shortness of breath, palpitations, lightheadedness, dizziness, headaches or BLE edema.  She will need to have her blood pressure checked in the office. She continues to decline being put on any additional blood pressure agents but would benefit from carvedilol or adding losartan combo BP Readings from Last 3 Encounters:  09/21/20 (!) 150/90  08/24/20 (!) 180/109  02/11/20 (!) 162/99        Past Medical History:  Diagnosis Date   Hyperlipidemia    Hypertension     History reviewed. No pertinent surgical history.  Family History  Problem Relation Age of Onset   Hypertension Mother    Hypertension Brother    Healthy Father     Social History    Socioeconomic History   Marital status: Single    Spouse name: Not on file   Number of children: Not on file   Years of education: Not on file   Highest education level: Not on file  Occupational History   Not on file  Tobacco Use   Smoking status: Never   Smokeless tobacco: Never  Vaping Use   Vaping Use: Never used  Substance and Sexual Activity   Alcohol use: Yes    Comment: occassionally   Drug use: Yes    Frequency: 3.0 times per week    Types: Marijuana    Comment: last use 05/09/15   Sexual activity: Yes    Birth control/protection: None, Condom  Other Topics Concern   Not on file  Social History Narrative   Not on file   Social Determinants of Health   Financial Resource Strain: Not on file  Food Insecurity: Not on file  Transportation Needs: Not on file  Physical Activity: Not on file  Stress: Not on file  Social Connections: Not on file     Observations/Objective: Awake, alert and oriented x 3   Review of Systems  Constitutional:  Negative for fever, malaise/fatigue and weight loss.  HENT: Negative.  Negative for nosebleeds.   Eyes: Negative.  Negative for blurred vision, double vision and photophobia.  Respiratory: Negative.  Negative for cough and shortness of breath.   Cardiovascular: Negative.  Negative for chest pain, palpitations and leg swelling.  Gastrointestinal: Negative.  Negative for heartburn, nausea and vomiting.  Musculoskeletal: Negative.  Negative for myalgias.  Neurological: Negative.  Negative for dizziness, focal weakness, seizures and headaches.  Psychiatric/Behavioral: Negative.  Negative for suicidal ideas.    Assessment and Plan: Diagnoses and all orders for this visit:  Essential hypertension Continue all antihypertensives as prescribed.  Remember to bring in your blood pressure log with you for your follow up appointment.  DASH/Mediterranean Diets are healthier choices for HTN.    Follow Up Instructions Return in about  3 months (around 08/17/2021).     I discussed the assessment and treatment plan with the patient. The patient was provided an opportunity to ask questions and all were answered. The patient agreed with the plan and demonstrated an understanding of the instructions.   The patient was advised to call back or seek an in-person evaluation if the symptoms worsen or if the condition fails to improve as anticipated.  I provided 11 minutes of non-face-to-face time during this encounter including median intraservice time, reviewing previous notes, labs, imaging, medications and explaining diagnosis and management.  [name], FNP-BC

## 2021-05-18 ENCOUNTER — Other Ambulatory Visit: Payer: Self-pay

## 2021-05-18 ENCOUNTER — Ambulatory Visit: Payer: Self-pay | Admitting: Pharmacist

## 2021-05-19 ENCOUNTER — Encounter: Payer: Self-pay | Admitting: Nurse Practitioner

## 2021-05-25 ENCOUNTER — Other Ambulatory Visit: Payer: Self-pay

## 2021-05-25 ENCOUNTER — Ambulatory Visit: Payer: Self-pay | Attending: Nurse Practitioner | Admitting: Pharmacist

## 2021-05-25 ENCOUNTER — Encounter: Payer: Self-pay | Admitting: Pharmacist

## 2021-05-25 VITALS — BP 140/92

## 2021-05-25 DIAGNOSIS — I10 Essential (primary) hypertension: Secondary | ICD-10-CM

## 2021-05-25 NOTE — Progress Notes (Signed)
   S:    PCP: Zelda   Patient arrives in good spirits. Presents to the clinic for hypertension evaluation, counseling, and management.   Patient was referred and last seen by Primary Care Provider on 05/17/21 via video visit. At that time patient continued to decline starting additional BP agents but Zelda noted she would benefit from addition of carvedilol or losartan. Referred to CPP for BP check.   Today, patient arrives endorsing some stress today after having a dentist appointment and court date. She reports adherence with her medications but has not taken her medications given her schedule today, so compliance is unclear. States she does not like to take her HCTZ when she is going to be in the sun.   Denies HA, chest pain, dyspnea, or blurred vision.   Current BP Medications include: HCTZ 25 mg daily, Amlodipine 10 mg daily  Dietary habits include: reports limiting salt; denies drinking caffeine   Exercise habits include: patient walks daily   Family / Social history:  - FHx: HTN (mother, brother) - Tobacco: never smoker - Alcohol: occasionally - Illicit substances: smokes marijuana occasionally   O:  Vitals:   05/25/21 1617  BP: (!) 140/92    Home BP readings: checks 3 times a day. Reports a range of 130s-150s/80-100s.   Last 3 Office BP readings: BP Readings from Last 3 Encounters:  05/25/21 (!) 140/92  09/21/20 (!) 150/90  08/24/20 (!) 180/109   BMET    Component Value Date/Time   NA 138 08/24/2020 1710   K 3.7 08/24/2020 1710   CL 101 08/24/2020 1710   CO2 23 08/24/2020 1710   GLUCOSE 79 08/24/2020 1710   GLUCOSE 92 05/10/2015 1319   BUN 18 08/24/2020 1710   CREATININE 0.99 08/24/2020 1710   CREATININE 1.07 05/10/2015 1319   CALCIUM 9.6 08/24/2020 1710   GFRNONAA 66 08/24/2020 1710   GFRNONAA 71 07/03/2014 1234   GFRAA 76 08/24/2020 1710   GFRAA 82 07/03/2014 1234   Renal function: CrCl cannot be calculated (Patient's most recent lab result is older  than the maximum 21 days allowed.).  Clinical ASCVD: No  The 10-year ASCVD risk score Mikey Bussing DC Jr., et al., 2013) is: 5.1%   Values used to calculate the score:     Age: 55 years     Sex: Female     Is Non-Hispanic African American: Yes     Diabetic: No     Tobacco smoker: No     Systolic Blood Pressure: [id number] mmHg     Is BP treated: Yes     HDL Cholesterol: 64 mg/dL     Total Cholesterol: 221 mg/dL   A/P: Hypertension longstanding currently uncontrolled but improved on current medications. BP Goal = <130/80 mmHg. Patient is adherent with current medications but she did not take her HCTZ today. She declines adding additional therapy at this time.  -Continued current regimen -Counseled on lifestyle modifications for blood pressure control including reduced dietary sodium, increased exercise, adequate sleep  Results reviewed and written information provided. Total time in face-to-face counseling 30 minutes.   F/U with PCP in September.   Benard Halsted, PharmD, [facility], [place] [phone]

## 2021-06-01 ENCOUNTER — Telehealth: Payer: Self-pay

## 2021-06-01 ENCOUNTER — Other Ambulatory Visit: Payer: Self-pay

## 2021-06-01 ENCOUNTER — Ambulatory Visit (INDEPENDENT_AMBULATORY_CARE_PROVIDER_SITE_OTHER): Payer: Self-pay | Admitting: Orthopedic Surgery

## 2021-06-01 DIAGNOSIS — M1711 Unilateral primary osteoarthritis, right knee: Secondary | ICD-10-CM

## 2021-06-01 NOTE — Telephone Encounter (Signed)
Noted  

## 2021-06-01 NOTE — Telephone Encounter (Signed)
Can we get right knee auth for gel injection?

## 2021-06-04 ENCOUNTER — Encounter: Payer: Self-pay | Admitting: Orthopedic Surgery

## 2021-06-04 MED ORDER — BUPIVACAINE HCL 0.25 % IJ SOLN
4.0000 mL | INTRAMUSCULAR | Status: AC | PRN
  Administered 2021-06-01: 4 mL via INTRA_ARTICULAR

## 2021-06-04 MED ORDER — LIDOCAINE HCL 1 % IJ SOLN
5.0000 mL | INTRAMUSCULAR | Status: AC | PRN
  Administered 2021-06-01: 5 mL

## 2021-06-04 NOTE — Progress Notes (Signed)
Office Visit Note   Patient: Tammy Barajas           Date of Birth: November 22, 1965           MRN: [id number] Visit Date: 06/01/2021 Requested by: [name], NP [place],  [place] [id number] PCP: [name], NP  Subjective: Chief Complaint  Patient presents with   Right Knee - Pain    HPI: Tammy Barajas is a 55 year old patient with right knee pain.  She has known arthritis.  Had aspiration and injection 04/20/2021.  Decision today was for or against MRI scan.  She states that the injection did her a whole lot.  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  She has had recurrence of the swelling in the knee.  She has been doing some fairly labor-intensive squat type exercises.             ROS: All systems reviewed are negative as they relate to the chief complaint within the history of present illness.  Patient denies  fevers or chills.   Assessment & Plan: Visit Diagnoses:  1. Unilateral primary osteoarthritis, right knee     Plan: Impression is right knee valgus arthritis with effusion and improvement with injection but recurrence of the effusion has made her knee painful again.  Plan is aspiration and injection of Toradol today.  Preapproved gel shot.  Want to try to delay knee replacement as long as possible.  She does have fairly significant valgus alignment when standing.  Follow-Up Instructions: Return if symptoms worsen or fail to improve.   Orders:  No orders of the defined types were placed in this encounter.  No orders of the defined types were placed in this encounter.     Procedures: Large Joint Inj: R knee on 06/01/2021 8:59 PM Indications: diagnostic evaluation, joint swelling and pain Details: 18 G 1.5 in needle, superolateral approach  Arthrogram: No  Medications: 5 mL lidocaine 1 %; 4 mL bupivacaine 0.25 % Outcome: tolerated well, no immediate complications Procedure, treatment alternatives, risks and benefits  explained, specific risks discussed. Consent was given by the patient. Immediately prior to procedure a time out was called to verify the correct patient, procedure, equipment, support staff and site/side marked as required. Patient was prepped and draped in the usual sterile fashion.   1 cc Toradol also injected   Clinical Data: No additional findings.  Objective: Vital Signs: There were no vitals taken for this visit.  Physical Exam:   Constitutional: Patient appears well-developed HEENT:  Head: Normocephalic Eyes:EOM are normal Neck: Normal range of motion Cardiovascular: Normal rate Pulmonary/chest: Effort normal Neurologic: Patient is alert Skin: Skin is warm Psychiatric: Patient has normal mood and affect   Ortho Exam: Orthopedic exam demonstrates valgus alignment more on the right than the left.  Pedal pulses palpable.  Moderate effusion is present on the right trace effusion on the left.  Collateral and cruciate ligaments are stable.  Lateral greater than medial joint line tenderness with mild patellofemoral crepitus bilaterally.  Specialty Comments:  No specialty comments available.  Imaging: No results found.   PMFS History: Patient Active Problem List   Diagnosis Date Noted   Essential hypertension 07/20/2016   Past Medical History:  Diagnosis Date   Hyperlipidemia    Hypertension     Family History  Problem Relation Age of Onset   Hypertension Mother    Hypertension Brother    Healthy Father  History reviewed. No pertinent surgical history. Social History   Occupational History   Not on file  Tobacco Use   Smoking status: Never   Smokeless tobacco: Never  Vaping Use   Vaping Use: Never used  Substance and Sexual Activity   Alcohol use: Yes    Comment: occassionally   Drug use: Yes    Frequency: 3.0 times per week    Types: Marijuana    Comment: last use 05/09/15   Sexual activity: Yes    Birth control/protection: None, Condom

## 2021-06-07 NOTE — Progress Notes (Signed)
Noted  

## 2021-07-12 ENCOUNTER — Other Ambulatory Visit: Payer: Self-pay | Admitting: Nurse Practitioner

## 2021-07-20 ENCOUNTER — Ambulatory Visit: Payer: Self-pay | Attending: Nurse Practitioner | Admitting: Nurse Practitioner

## 2021-07-20 ENCOUNTER — Encounter: Payer: Self-pay | Admitting: Nurse Practitioner

## 2021-07-20 ENCOUNTER — Other Ambulatory Visit: Payer: Self-pay

## 2021-07-20 VITALS — BP 162/104 | HR 81 | Ht 67.0 in | Wt 237.4 lb

## 2021-07-20 DIAGNOSIS — I10 Essential (primary) hypertension: Secondary | ICD-10-CM

## 2021-07-20 DIAGNOSIS — Z130 Encounter for screening for diseases of the blood and blood-forming organs and certain disorders involving the immune mechanism: Secondary | ICD-10-CM

## 2021-07-20 MED ORDER — HYDROCHLOROTHIAZIDE 25 MG PO TABS: 25.0000 mg | ORAL_TABLET | Freq: Every day | ORAL | 1 refills | Status: DC

## 2021-07-20 NOTE — Patient Instructions (Signed)
Turmeric  Ginger

## 2021-07-20 NOTE — Progress Notes (Addendum)
Assessment & Plan:  Tammy Barajas was seen today for hypertension.  Diagnoses and all orders for this visit:  Primary hypertension -     CMP14+EGFR -     hydrochlorothiazide (HYDRODIURIL) 25 MG tablet; Take 1 tablet (25 mg total) by mouth daily. Continue all antihypertensives as prescribed.  Remember to bring in your blood pressure log with you for your follow up appointment.  DASH/Mediterranean Diets are healthier choices for HTN.    Screening for deficiency anemia -     CBC   Patient has been counseled on age-appropriate routine health concerns for screening and prevention. These are reviewed and up-to-date. Referrals have been placed accordingly. Immunizations are up-to-date or declined.    Subjective:   Chief Complaint  Patient presents with   Hypertension   Tammy Barajas 55 y.o. female presents to office today for HTN. She has a past medical history of Hyperlipidemia and Hypertension.    HTN Blood pressure is elevated today. She endorses adherence taking Amlodipine 10 mg daily and HCTZ 25 mg daily. Denies chest pain, shortness of breath, palpitations, lightheadedness, dizziness, headaches or BLE edema.  She refuses to start any additional blood pressure medication today.  BP Readings from Last 3 Encounters:  07/20/21 (!) 162/104  05/25/21 (!) 140/92  09/21/20 (!) 150/90     Review of Systems  Constitutional:  Negative for fever, malaise/fatigue and weight loss.  HENT: Negative.  Negative for nosebleeds.   Eyes: Negative.  Negative for blurred vision, double vision and photophobia.  Respiratory: Negative.  Negative for cough and shortness of breath.   Cardiovascular: Negative.  Negative for chest pain, palpitations and leg swelling.  Gastrointestinal: Negative.  Negative for heartburn, nausea and vomiting.  Musculoskeletal: Negative.  Negative for myalgias.  Neurological: Negative.  Negative for dizziness, focal weakness, seizures and headaches.   Psychiatric/Behavioral: Negative.  Negative for suicidal ideas.    Past Medical History:  Diagnosis Date   Hyperlipidemia    Hypertension     History reviewed. No pertinent surgical history.  Family History  Problem Relation Age of Onset   Hypertension Mother    Hypertension Brother    Healthy Father     Social History Reviewed with no changes to be made today.   Outpatient Medications Prior to Visit  Medication Sig Dispense Refill   amLODipine (NORVASC) 10 MG tablet Take 1 tablet by mouth once daily 90 tablet 0   Cholecalciferol (VITAMIN D3 PO) Take by mouth.     Multiple Vitamin (MULTIVITAMIN) tablet Take 1 tablet by mouth daily.     hydrochlorothiazide (HYDRODIURIL) 25 MG tablet Take 1 tablet by mouth once daily 90 tablet 0   No facility-administered medications prior to visit.    No Known Allergies     Objective:    BP (!) 162/104   Pulse 81   Ht _0  (1.702 m)   Wt 237 lb 6 oz (107.7 kg)   SpO2 99%   BMI 37.18 kg/m  Wt Readings from Last 3 Encounters:  07/20/21 237 lb 6 oz (107.7 kg)  08/24/20 234 lb (106.1 kg)  02/11/20 235 lb (106.6 kg)    Physical Exam Vitals and nursing note reviewed.  Constitutional:      Appearance: She is well-developed.  HENT:     Head: Normocephalic and atraumatic.  Cardiovascular:     Rate and Rhythm: Normal rate and regular rhythm.     Heart sounds: Normal heart sounds. No murmur heard.   No friction rub. No  gallop.  Pulmonary:     Effort: Pulmonary effort is normal. No tachypnea or respiratory distress.     Breath sounds: Normal breath sounds. No decreased breath sounds, wheezing, rhonchi or rales.  Chest:     Chest wall: No tenderness.  Abdominal:     General: Bowel sounds are normal.     Palpations: Abdomen is soft.  Musculoskeletal:        General: Normal range of motion.     Cervical back: Normal range of motion.  Skin:    General: Skin is warm and dry.  Neurological:     Mental Status: She is alert and  oriented to person, place, and time.     Coordination: Coordination normal.  Psychiatric:        Behavior: Behavior normal. Behavior is cooperative.        Thought Content: Thought content normal.        Judgment: Judgment normal.         Patient has been counseled extensively about nutrition and exercise as well as the importance of adherence with medications and regular follow-up. The patient was given clear instructions to go to ER or return to medical center if symptoms don't improve, worsen or new problems develop. The patient verbalized understanding.   Follow-up: Return in about 3 months (around 10/19/2021).   [name], FNP-BC [facility] and [facility] [place], [place]   07/20/2021, 3:54 PM

## 2021-07-21 LAB — CMP14+EGFR
ALT: 12 IU/L (ref 0–32)
AST: 16 IU/L (ref 0–40)
Albumin/Globulin Ratio: 1.3 (ref 1.2–2.2)
Albumin: 4.4 g/dL (ref 3.8–4.9)
Alkaline Phosphatase: 63 IU/L (ref 44–121)
BUN/Creatinine Ratio: 13 (ref 9–23)
BUN: 13 mg/dL (ref 6–24)
Bilirubin Total: 0.5 mg/dL (ref 0.0–1.2)
CO2: 20 mmol/L (ref 20–29)
Calcium: 9.7 mg/dL (ref 8.7–10.2)
Chloride: 98 mmol/L (ref 96–106)
Creatinine, Ser: 0.99 mg/dL (ref 0.57–1.00)
Globulin, Total: 3.4 g/dL (ref 1.5–4.5)
Glucose: 99 mg/dL (ref 70–99)
Potassium: 3.4 mmol/L — ABNORMAL LOW (ref 3.5–5.2)
Sodium: 137 mmol/L (ref 134–144)
Total Protein: 7.8 g/dL (ref 6.0–8.5)
eGFR: 69 mL/min/{1.73_m2} (ref >59)

## 2021-07-21 LAB — CBC
Hematocrit: 39.3 % (ref 34.0–46.6)
Hemoglobin: 13 g/dL (ref 11.1–15.9)
MCH: 29.4 pg (ref 26.6–33.0)
MCHC: 33.1 g/dL (ref 31.5–35.7)
MCV: 89 fL (ref 79–97)
Platelets: 330 10*3/uL (ref 150–450)
RBC: 4.42 x10E6/uL (ref 3.77–5.28)
RDW: 13.3 % (ref 11.7–15.4)
WBC: 6 10*3/uL (ref 3.4–10.8)

## 2021-07-27 ENCOUNTER — Telehealth: Payer: Self-pay

## 2021-07-27 NOTE — Telephone Encounter (Signed)
Mailed [employer] application to patient's address listed in chart for Monovisc, right knee.

## 2021-09-23 ENCOUNTER — Telehealth: Payer: Self-pay | Admitting: Nurse Practitioner

## 2021-09-23 NOTE — Telephone Encounter (Signed)
Pt is calling back Cb- [phone]

## 2021-09-23 NOTE — Telephone Encounter (Signed)
I return Pt call,schedule a financial appt for 09/28/21

## 2021-09-23 NOTE — Telephone Encounter (Signed)
I try to contact the Pt since she drop-up her application (incomplete) VM is full, if she call please transfer to me line the financial, she need to schedule a financial appt

## 2021-09-28 ENCOUNTER — Ambulatory Visit: Payer: Self-pay

## 2021-09-30 ENCOUNTER — Telehealth: Payer: Self-pay

## 2021-09-30 NOTE — Telephone Encounter (Signed)
Received [employer] application from patient. Faxed completed [employer] application to [phone] for Monovisc, right knee.

## 2021-10-06 ENCOUNTER — Other Ambulatory Visit: Payer: Self-pay

## 2021-10-06 ENCOUNTER — Ambulatory Visit: Payer: Self-pay | Attending: Nurse Practitioner

## 2021-10-21 ENCOUNTER — Encounter: Payer: Self-pay | Admitting: Nurse Practitioner

## 2021-10-21 ENCOUNTER — Other Ambulatory Visit: Payer: Self-pay | Admitting: Nurse Practitioner

## 2021-10-21 ENCOUNTER — Ambulatory Visit: Payer: Self-pay | Attending: Nurse Practitioner | Admitting: Nurse Practitioner

## 2021-10-21 ENCOUNTER — Other Ambulatory Visit: Payer: Self-pay

## 2021-10-21 VITALS — BP 161/104 | HR 96 | Ht 67.0 in | Wt 239.2 lb

## 2021-10-21 DIAGNOSIS — L659 Nonscarring hair loss, unspecified: Secondary | ICD-10-CM

## 2021-10-21 DIAGNOSIS — E559 Vitamin D deficiency, unspecified: Secondary | ICD-10-CM

## 2021-10-21 DIAGNOSIS — Z1231 Encounter for screening mammogram for malignant neoplasm of breast: Secondary | ICD-10-CM

## 2021-10-21 DIAGNOSIS — N923 Ovulation bleeding: Secondary | ICD-10-CM

## 2021-10-21 DIAGNOSIS — I10 Essential (primary) hypertension: Secondary | ICD-10-CM

## 2021-10-21 LAB — POCT URINE PREGNANCY: Preg Test, Ur: NEGATIVE

## 2021-10-21 MED ORDER — AMLODIPINE BESYLATE 10 MG PO TABS: 10.0000 mg | ORAL_TABLET | Freq: Every day | ORAL | 0 refills | Status: DC

## 2021-10-21 NOTE — Progress Notes (Signed)
Assessment & Plan:  Tammy Barajas was seen today for hypertension.  Diagnoses and all orders for this visit:  Essential hypertension -     CMP14+EGFR -     amLODipine (NORVASC) 10 MG tablet; Take 1 tablet (10 mg total) by mouth daily.  Breast cancer screening by mammogram -     MS DIGITAL SCREENING TOMO BILATERAL; Future  Spotting between menses -     POCT urine pregnancy -     US PELVIC COMPLETE WITH TRANSVAGINAL; Future  Vitamin D deficiency disease -     VITAMIN D 25 Hydroxy (Vit-D Deficiency, Fractures)  Alopecia -     Ambulatory referral to Dermatology    Patient has been counseled on age-appropriate routine health concerns for screening and prevention. These are reviewed and up-to-date. Referrals have been placed accordingly. Immunizations are up-to-date or declined.    Subjective:   Chief Complaint  Patient presents with   Hypertension    Tammy Barajas 55 y.o. female presents to office today for follow up to HTN  has a past medical history of Hyperlipidemia and Hypertension.   HTN Blood pressure is elevated today. She is not taking Amlodipine 10 mg daily and HCTZ 25 mg daily as prescribed. Denies chest pain, shortness of breath, palpitations, lightheadedness, dizziness, or BLE edema.  She adamantly declines any additional blood pressure agents today.  BP Readings from Last 3 Encounters:  10/21/21 (!) 161/104  07/20/21 (!) 162/104  05/25/21 (!) 140/92    AUB Notes spotting, irregular menstrual cycles and bleeding during sex.   Alopecia She has a large bald spot adjacent to the left occipital area. She believes her vitamin D is causing this however it appears she may have more of an alopecia condition that is not related to her medications. She also has an area at the top of the scalp.   Review of Systems  Constitutional:  Negative for fever, malaise/fatigue and weight loss.  HENT: Negative.  Negative for nosebleeds.   Eyes: Negative.  Negative for blurred  vision, double vision and photophobia.  Respiratory: Negative.  Negative for cough and shortness of breath.   Cardiovascular: Negative.  Negative for chest pain, palpitations and leg swelling.  Gastrointestinal: Negative.  Negative for heartburn, nausea and vomiting.  Genitourinary:        SEE HPI  Musculoskeletal: Negative.  Negative for myalgias.  Skin:        SEE HPI  Neurological: Negative.  Negative for dizziness, focal weakness, seizures and headaches.  Psychiatric/Behavioral: Negative.  Negative for suicidal ideas.    Past Medical History:  Diagnosis Date   Hyperlipidemia    Hypertension     History reviewed. No pertinent surgical history.  Family History  Problem Relation Age of Onset   Hypertension Mother    Hypertension Brother    Healthy Father     Social History Reviewed with no changes to be made today.   Outpatient Medications Prior to Visit  Medication Sig Dispense Refill   Multiple Vitamin (MULTIVITAMIN) tablet Take 1 tablet by mouth daily.     amLODipine (NORVASC) 10 MG tablet Take 1 tablet by mouth once daily 90 tablet 0   Cholecalciferol (VITAMIN D3 PO) Take by mouth. (Patient not taking: Reported on 10/21/2021)     hydrochlorothiazide (HYDRODIURIL) 25 MG tablet Take 1 tablet (25 mg total) by mouth daily. 90 tablet 1   No facility-administered medications prior to visit.    No Known Allergies     Objective:  BP (!) 161/104    Pulse 96    Ht 5' 7"  (1.702 m)    Wt 239 lb 4 oz (108.5 kg)    LMP 06/29/2021 (Approximate)    SpO2 91%    BMI 37.47 kg/m  Wt Readings from Last 3 Encounters:  10/21/21 239 lb 4 oz (108.5 kg)  07/20/21 237 lb 6 oz (107.7 kg)  08/24/20 234 lb (106.1 kg)    Physical Exam Vitals and nursing note reviewed.  Constitutional:      Appearance: She is well-developed.  HENT:     Head: Normocephalic and atraumatic.   Cardiovascular:     Rate and Rhythm: Normal rate and regular rhythm.     Heart sounds: Normal heart sounds.  No murmur heard.   No friction rub. No gallop.  Pulmonary:     Effort: Pulmonary effort is normal. No tachypnea or respiratory distress.     Breath sounds: Normal breath sounds. No decreased breath sounds, wheezing, rhonchi or rales.  Chest:     Chest wall: No tenderness.  Abdominal:     General: Bowel sounds are normal.     Palpations: Abdomen is soft.  Musculoskeletal:        General: Normal range of motion.     Cervical back: Normal range of motion.  Skin:    General: Skin is warm and dry.  Neurological:     Mental Status: She is alert and oriented to person, place, and time.     Coordination: Coordination normal.  Psychiatric:        Behavior: Behavior normal. Behavior is cooperative.        Thought Content: Thought content normal.        Judgment: Judgment normal.         Patient has been counseled extensively about nutrition and exercise as well as the importance of adherence with medications and regular follow-up. The patient was given clear instructions to go to ER or return to medical center if symptoms don't improve, worsen or new problems develop. The patient verbalized understanding.   Follow-up: Return in about 3 months (around 01/19/2022) for NEEDS 1110 or 410 appt only. [name], FNP-BC [facility] and [facility] [place], [place]   10/21/2021, 12:18 PM

## 2021-10-22 LAB — CMP14+EGFR
ALT: 15 IU/L (ref 0–32)
AST: 15 IU/L (ref 0–40)
Albumin/Globulin Ratio: 1.2 (ref 1.2–2.2)
Albumin: 4.2 g/dL (ref 3.8–4.9)
Alkaline Phosphatase: 72 IU/L (ref 44–121)
BUN/Creatinine Ratio: 14 (ref 9–23)
BUN: 13 mg/dL (ref 6–24)
Bilirubin Total: 0.3 mg/dL (ref 0.0–1.2)
CO2: 22 mmol/L (ref 20–29)
Calcium: 9.9 mg/dL (ref 8.7–10.2)
Chloride: 102 mmol/L (ref 96–106)
Creatinine, Ser: 0.9 mg/dL (ref 0.57–1.00)
Globulin, Total: 3.4 g/dL (ref 1.5–4.5)
Glucose: 118 mg/dL — ABNORMAL HIGH (ref 70–99)
Potassium: 3.8 mmol/L (ref 3.5–5.2)
Sodium: 139 mmol/L (ref 134–144)
Total Protein: 7.6 g/dL (ref 6.0–8.5)
eGFR: 76 mL/min/{1.73_m2} (ref >59)

## 2021-10-22 LAB — VITAMIN D 25 HYDROXY (VIT D DEFICIENCY, FRACTURES): Vit D, 25-Hydroxy: 36.1 ng/mL (ref 30.0–100.0)

## 2021-10-25 ENCOUNTER — Telehealth: Payer: Self-pay

## 2021-10-25 NOTE — Telephone Encounter (Signed)
Mar/LM for patient-requesting a c/b to r/s 10/27/21 appt (staffing).

## 2021-10-26 ENCOUNTER — Telehealth: Payer: Self-pay

## 2021-10-26 NOTE — Telephone Encounter (Signed)
mar/lm for patient (to make sure msg was rec'd about 10-27-21 appt canceled due to staffing)-requesting pt to c/b to r/s.

## 2021-10-27 ENCOUNTER — Telehealth: Payer: Self-pay

## 2021-10-27 ENCOUNTER — Other Ambulatory Visit: Payer: Self-pay

## 2021-10-27 LAB — THYROID PANEL WITH TSH

## 2021-10-27 LAB — SPECIMEN STATUS REPORT

## 2021-10-27 NOTE — Telephone Encounter (Signed)
Left message to return call to our office.  

## 2021-10-27 NOTE — Telephone Encounter (Signed)
-----   Message from [name], NP sent at 10/25/2021  8:34 PM EST ----- Normal vitamin D. Negative urine pregnancy test. Kidney, liver function and electrolytes are normal.

## 2021-10-28 ENCOUNTER — Telehealth: Payer: Self-pay | Admitting: Nurse Practitioner

## 2021-10-28 NOTE — Telephone Encounter (Signed)
Pt was sent a letter from financial dept. Inform them, that the application they submitted was incomplete, since they were missing some documentation at the time of the appointment, Pt need to reschedule and resubmit all new papers and application for [employer] and [employer], P.S. old documents has been sent back by mail to the Pt and Pt. need to make a new appt. Original deadline 10/17/21 extend 10/28/21

## 2021-10-31 ENCOUNTER — Ambulatory Visit
Admission: RE | Admit: 2021-10-31 | Discharge: 2021-10-31 | Disposition: A | Discharge status: Home / Self Care | Payer: Self-pay | Source: Ambulatory Visit | Attending: Nurse Practitioner | Admitting: Nurse Practitioner

## 2021-10-31 ENCOUNTER — Other Ambulatory Visit: Payer: Self-pay

## 2021-10-31 DIAGNOSIS — N923 Ovulation bleeding: Secondary | ICD-10-CM | POA: Insufficient documentation

## 2021-11-03 ENCOUNTER — Telehealth: Payer: Self-pay | Admitting: *Deleted

## 2021-11-03 NOTE — Telephone Encounter (Signed)
Reviewed lab results, pelvic ultrasound results and providers' notes. Answered all questions at this time.

## 2022-01-05 ENCOUNTER — Ambulatory Visit: Payer: Self-pay | Admitting: *Deleted

## 2022-01-05 ENCOUNTER — Ambulatory Visit: Payer: Self-pay | Admitting: Family

## 2022-01-05 NOTE — Telephone Encounter (Signed)
? ?  Chief Complaint: vaginal bleeding most of the time ?Symptoms: bleeding and spotting ?Frequency: almost everyday ?Pertinent Negatives: Patient denies na ?Disposition: '[]'$ ED /'[]'$ Urgent Care (no appt availability in office) / '[x]'$ Appointment(In office/virtual)/ '[]'$  [facility]/ '[]'$ Home Care/ '[]'$ Refused Recommended Disposition /'[]'$ [place] [facility]/ '[]'$  Follow-up with PCP ?Additional Notes: Was able to get appt today at 4:20. Pt states she has own transportation.  ?Reason for Disposition ? [1] Periods with > 6 soaked pads or tampons per day AND [2] last > 7 days ? ?Answer Assessment - Initial Assessment Questions ?1. AMOUNT: "Describe the bleeding that you are having."  ?  - SPOTTING: spotting, or pinkish / brownish mucous discharge; does not fill panty liner or pad  ?  - MILD:  less than 1 pad / hour; less than patient's usual menstrual bleeding ?  - MODERATE: 1-2 pads / hour; 1 menstrual cup every 6 hours; small-medium blood clots (e.g., pea, grape, small coin) ?  - SEVERE: soaking 2 or more pads/hour for 2 or more hours; 1 menstrual cup every 2 hours; bleeding not contained by pads or continuous red blood from vagina; large blood clots (e.g., golf ball, large coin)  ?    Bleeding at first now spotting ?2. ONSET: "When did the bleeding begin?" "Is it continuing now?" ?    November except off a week or two ?3. MENSTRUAL PERIOD: "When was the last normal menstrual period?" "How is this different than your period?" ?    Its almost continuous ?4. REGULARITY: "How regular are your periods?" ?    All the time ?5. ABDOMINAL PAIN: "Do you have any pain?" "How bad is the pain?"  (e.g., Scale 1-10; mild, moderate, or severe) ?  - MILD (1-3): doesn't interfere with normal activities, abdomen soft and not tender to touch  ?  - MODERATE (4-7): interferes with normal activities or awakens from sleep, abdomen tender to touch  ?  - SEVERE (8-10): excruciating pain, doubled over, unable to do any normal activities  ?     no ?6. PREGNANCY: "Could you be pregnant?" "Are you sexually active?" "Did you recently give birth?" ?    no ? ?9. BLOOD THINNERS: "Do you take any blood thinners?" (e.g., Coumadin/warfarin, Pradaxa/dabigatran, aspirin) ?    no ? ?12. OTHER SYMPTOMS: "What other symptoms are you having with the bleeding?" (e.g., passed tissue, vaginal discharge, fever, menstrual-type cramps) ?      Just period ? ?Protocols used: Vaginal Bleeding - Abnormal-A-AH ? ?

## 2022-01-23 ENCOUNTER — Telehealth: Payer: Self-pay | Admitting: Nurse Practitioner

## 2022-01-23 ENCOUNTER — Ambulatory Visit: Payer: No Typology Code available for payment source | Admitting: Nurse Practitioner

## 2022-01-23 ENCOUNTER — Other Ambulatory Visit: Payer: Self-pay | Admitting: Nurse Practitioner

## 2022-01-23 DIAGNOSIS — D219 Benign neoplasm of connective and other soft tissue, unspecified: Secondary | ICD-10-CM

## 2022-01-23 DIAGNOSIS — N921 Excessive and frequent menstruation with irregular cycle: Secondary | ICD-10-CM

## 2022-01-23 NOTE — Telephone Encounter (Signed)
No answer. LVM ?

## 2022-01-23 NOTE — Telephone Encounter (Signed)
No answer LVM '@10'$ :26am ? ?No Answer LVM and sent text message @ 9:22am ?

## 2022-01-26 ENCOUNTER — Telehealth: Payer: Self-pay | Admitting: Nurse Practitioner

## 2022-01-26 NOTE — Telephone Encounter (Signed)
Patient called, no answer, mailbox is full. If patient is agreeable, she can be schedule with Carrolyn Meiers, PA-C at [facility] on Monday, 01/30/22 as a Mobile Bus patient. ?

## 2022-01-26 NOTE — Telephone Encounter (Signed)
Copied from [place] [id number]. Topic: Appointment Scheduling - Scheduling Inquiry for Clinic ?>> Jan 26, 2022  4:33 PM Yvette Rack wrote: ?Reason for CRM: Pt stated her appt on 01/23/22 was cancelled and she needs to see [name] asap because she is still bleeding and experiencing hair loss. Pt stated she may be going through perimenopause. Attempted to get pt scheduled but she declined because there was no availability next week. Pt requests that a nurse return her call to advise of options or sooner appt. ?

## 2022-01-27 NOTE — Telephone Encounter (Signed)
Pt called, advised pt no appts at [place] but was able to get her seen at [facility] with Cari. Appt scheduled for 01/30/22 at 1500. Address was provided to pt. No further assistance needed. ?

## 2022-01-30 ENCOUNTER — Ambulatory Visit (INDEPENDENT_AMBULATORY_CARE_PROVIDER_SITE_OTHER): Payer: Self-pay | Admitting: Physician Assistant

## 2022-01-30 ENCOUNTER — Encounter: Payer: Self-pay | Admitting: Physician Assistant

## 2022-01-30 VITALS — BP 171/97 | HR 87 | Temp 98.8°F | Resp 18 | Ht 67.0 in | Wt 245.0 lb

## 2022-01-30 DIAGNOSIS — E785 Hyperlipidemia, unspecified: Secondary | ICD-10-CM

## 2022-01-30 DIAGNOSIS — Z6838 Body mass index (BMI) 38.0-38.9, adult: Secondary | ICD-10-CM

## 2022-01-30 DIAGNOSIS — L659 Nonscarring hair loss, unspecified: Secondary | ICD-10-CM

## 2022-01-30 DIAGNOSIS — N939 Abnormal uterine and vaginal bleeding, unspecified: Secondary | ICD-10-CM

## 2022-01-30 DIAGNOSIS — E66812 Obesity, class 2: Secondary | ICD-10-CM

## 2022-01-30 DIAGNOSIS — I10 Essential (primary) hypertension: Secondary | ICD-10-CM

## 2022-01-30 DIAGNOSIS — E6609 Other obesity due to excess calories: Secondary | ICD-10-CM

## 2022-01-30 MED ORDER — MEGESTROL ACETATE 20 MG PO TABS: 20.0000 mg | ORAL_TABLET | Freq: Two times a day (BID) | ORAL | 0 refills | Status: AC

## 2022-01-30 NOTE — Progress Notes (Signed)
? ?Established Patient Office Visit ? ?Subjective:  ?Patient ID: Tammy Barajas, female    DOB: 1965/12/31  Age: 56 y.o. MRN: [id number] ? ?CC:  ?Chief Complaint  ?Patient presents with  ? Alopecia  ? Dysmenorrhea  ? ? ?HPI ?Tammy Barajas states that she has been having abnormal uterine bleeding, states that she has been having a continuous menses, states that she had a full menses in February, another 1 in March, and has been spotting in between.  Does endorse that this has been ongoing for approximately 6 months. ? ?States that she continues to be concerned of hair loss, states that she stopped taking her vitamin D thinking this may be causing it. ? ?States that she has not been checking her blood pressure at home, states that she does not pay attention to the amount of sodium in her food, states that she drinks approximately 1 bottle of water a day. ? ?Denies tobacco use, but does endorse marijuana use. ? ?Past Medical History:  ?Diagnosis Date  ? Hyperlipidemia   ? Hypertension   ? ? ?History reviewed. No pertinent surgical history. ? ?Family History  ?Problem Relation Age of Onset  ? Hypertension Mother   ? Hypertension Brother   ? Healthy Father   ? ? ?Social History  ? ?Socioeconomic History  ? Marital status: Single  ?  Spouse name: Not on file  ? Number of children: Not on file  ? Years of education: Not on file  ? Highest education level: Not on file  ?Occupational History  ? Not on file  ?Tobacco Use  ? Smoking status: Never  ? Smokeless tobacco: Never  ?Vaping Use  ? Vaping Use: Never used  ?Substance and Sexual Activity  ? Alcohol use: Yes  ?  Comment: occassionally  ? Drug use: Yes  ?  Frequency: 3.0 times per week  ?  Types: Marijuana  ?  Comment: last use 05/09/15  ? Sexual activity: Yes  ?  Birth control/protection: None, Condom  ?Other Topics Concern  ? Not on file  ?Social History Narrative  ? Not on file  ? ?Social Determinants of Health  ? ?Financial Resource Strain: Not on file  ?Food  Insecurity: Not on file  ?Transportation Needs: Not on file  ?Physical Activity: Not on file  ?Stress: Not on file  ?Social Connections: Not on file  ?Intimate Partner Violence: Not on file  ? ? ?Outpatient Medications Prior to Visit  ?Medication Sig Dispense Refill  ? amLODipine (NORVASC) 10 MG tablet Take 1 tablet (10 mg total) by mouth daily. 90 tablet 0  ? Cholecalciferol (VITAMIN D3 PO) Take by mouth.    ? hydrochlorothiazide (HYDRODIURIL) 25 MG tablet Take 1 tablet (25 mg total) by mouth daily. 90 tablet 1  ? Multiple Vitamin (MULTIVITAMIN) tablet Take 1 tablet by mouth daily.    ? ?No facility-administered medications prior to visit.  ? ? ?No Known Allergies ? ?ROS ?Review of Systems  ?Constitutional: Negative.   ?HENT: Negative.    ?Eyes: Negative.   ?Respiratory:  Negative for cough.   ?Cardiovascular:  Negative for chest pain.  ?Gastrointestinal:  Negative for abdominal pain, diarrhea, nausea and vomiting.  ?Endocrine: Negative.   ?Genitourinary:  Positive for menstrual problem and vaginal bleeding. Negative for dysuria, frequency, genital sores, hematuria, pelvic pain and vaginal discharge.  ?Musculoskeletal: Negative.   ?Skin: Negative.   ?Allergic/Immunologic: Negative.   ?Neurological: Negative.   ?Hematological: Negative.   ?Psychiatric/Behavioral: Negative.    ? ?  ?  Objective:  ?  ?Physical Exam ?Vitals and nursing note reviewed.  ?Constitutional:   ?   Appearance: Normal appearance. She is obese.  ?HENT:  ?   Head: Normocephalic and atraumatic.  ?   Right Ear: External ear normal.  ?   Left Ear: External ear normal.  ?   Nose: Nose normal.  ?   Mouth/Throat:  ?   Mouth: Mucous membranes are moist.  ?   Pharynx: Oropharynx is clear.  ?Eyes:  ?   Extraocular Movements: Extraocular movements intact.  ?   Conjunctiva/sclera: Conjunctivae normal.  ?   Pupils: Pupils are equal, round, and reactive to light.  ?Cardiovascular:  ?   Rate and Rhythm: Regular rhythm.  ?   Pulses: Normal pulses.  ?   Heart  sounds: Normal heart sounds.  ?Pulmonary:  ?   Effort: Pulmonary effort is normal.  ?   Breath sounds: Normal breath sounds.  ?Musculoskeletal:     ?   General: Normal range of motion.  ?   Cervical back: Normal range of motion and neck supple.  ?Skin: ?   General: Skin is warm and dry.  ?   Comments: Wearing weave, unable to assess hair loss, or patches  ?Neurological:  ?   General: No focal deficit present.  ?   Mental Status: She is alert and oriented to person, place, and time.  ?Psychiatric:     ?   Mood and Affect: Mood normal.     ?   Behavior: Behavior normal.     ?   Thought Content: Thought content normal.     ?   Judgment: Judgment normal.  ? ? ?BP (!) 171/97 (BP Location: Right Arm, Patient Position: Sitting, Cuff Size: Normal)   Pulse 87   Temp 98.8 ?F (37.1 ?C) (Oral)   Resp 18   Ht 5' 7"  (1.702 m)   Wt 245 lb (111.1 kg)   LMP 01/21/2022   SpO2 95%   BMI 38.37 kg/m?  ?Wt Readings from Last 3 Encounters:  ?01/30/22 245 lb (111.1 kg)  ?10/21/21 239 lb 4 oz (108.5 kg)  ?07/20/21 237 lb 6 oz (107.7 kg)  ? ? ? ?Health Maintenance Due  ?Topic Date Due  ? COLON CANCER SCREENING ANNUAL FOBT  06/30/2020  ? ? ?There are no preventive care reminders to display for this patient. ? ?Lab Results  ?Component Value Date  ? TSH CANCELED 10/21/2021  ? ?Lab Results  ?Component Value Date  ? WBC 6.0 07/20/2021  ? HGB 13.0 07/20/2021  ? HCT 39.3 07/20/2021  ? MCV 89 07/20/2021  ? PLT 330 07/20/2021  ? ?Lab Results  ?Component Value Date  ? NA 139 10/21/2021  ? K 3.8 10/21/2021  ? CO2 22 10/21/2021  ? GLUCOSE 118 (H) 10/21/2021  ? BUN 13 10/21/2021  ? CREATININE 0.90 10/21/2021  ? BILITOT 0.3 10/21/2021  ? ALKPHOS 72 10/21/2021  ? AST 15 10/21/2021  ? ALT 15 10/21/2021  ? PROT 7.6 10/21/2021  ? ALBUMIN 4.2 10/21/2021  ? CALCIUM 9.9 10/21/2021  ? EGFR 76 10/21/2021  ? ?Lab Results  ?Component Value Date  ? CHOL 221 (H) 12/04/2019  ? ?Lab Results  ?Component Value Date  ? HDL 64 12/04/2019  ? ?Lab Results  ?Component  Value Date  ? LDLCALC 137 (H) 12/04/2019  ? ?Lab Results  ?Component Value Date  ? TRIG 114 12/04/2019  ? ?Lab Results  ?Component Value Date  ? CHOLHDL 3.5 12/04/2019  ? ?  Lab Results  ?Component Value Date  ? HGBA1C 5.6 12/04/2019  ? ? ?  ?Assessment & Plan:  ? ?Problem List Items Addressed This Visit   ? ?  ? Cardiovascular and Mediastinum  ? Essential hypertension - Primary  ? Relevant Orders  ? CBC with Differential/Platelet  ? Comp. Metabolic Panel (12)  ? ?Other Visit Diagnoses   ? ? Alopecia      ? Relevant Orders  ? ANA w/Reflex if Positive  ? Thyroid Panel With TSH  ? Abnormal uterine bleeding      ? Relevant Medications  ? megestrol (MEGACE) 20 MG tablet  ? Other Relevant Orders  ? Thyroid Panel With TSH  ? Estradiol  ? FSH/LH  ? Hyperlipidemia, unspecified hyperlipidemia type      ? Relevant Orders  ? Lipid panel  ? ?  ? ? ?Meds ordered this encounter  ?Medications  ? megestrol (MEGACE) 20 MG tablet  ?  Sig: Take 1 tablet (20 mg total) by mouth 2 (two) times daily for 5 days.  ?  Dispense:  10 tablet  ?  Refill:  0  ?  Order Specific Question:   Supervising Provider  ?  Answer:   Elsie Stain [[id number]]  ? ?1. Abnormal uterine bleeding ?Trial Megace.  Patient education given on supportive care.  Patient given appointment to follow-up with primary care provider in 1 to 2 months, encouraged to follow-up with mobile team if needed.  Red flags given for prompt reevaluation ?- Thyroid Panel With TSH ?- Estradiol ?- FSH/LH ?- megestrol (MEGACE) 20 MG tablet; Take 1 tablet (20 mg total) by mouth 2 (two) times daily for 5 days.  Dispense: 10 tablet; Refill: 0 ? ?2. Essential hypertension ?Patient encouraged to check blood pressure at home on a daily basis, keep a written log and have available for all office visits.  Red flags given for prompt reevaluation.  Patient counseled on low-sodium diet, increasing hydration. ?- CBC with Differential/Platelet ?- Comp. Metabolic Panel (12) ? ?3. Hyperlipidemia,  unspecified hyperlipidemia type ?Fasting labs completed today ?- Lipid panel ? ?4. Alopecia ? ?- ANA w/Reflex if Positive ?- Thyroid Panel With TSH ? ?5. Class 2 obesity due to excess calories without serious comorbidity with b

## 2022-01-30 NOTE — Patient Instructions (Signed)
You are going to take Megace twice a day for 5 days. ? ?I encourage you to lower the sodium in your diet, make sure you are drinking at least 64 ounces of water a day, and check your blood pressure on a daily basis.  Keep a written log of your blood pressure readings, and have them available for all your office visits. ? ?We will call you with today's lab results ? ?Kennieth Rad, PA-C ?Physician Assistant ?[facility] ?[email or web address] ? ? ?Abnormal Uterine Bleeding ?Abnormal uterine bleeding is unusual bleeding from the uterus. It includes bleeding after sex, or bleeding or spotting between menstrual periods. It may also include bleeding that is heavier than normal, menstrual periods that last longer than usual, or bleeding that occurs after menopause. ?Abnormal uterine bleeding can affect teenagers, women in their reproductive years, pregnant women, and women who have reached menopause. Common causes of abnormal uterine bleeding include: ?Pregnancy. ?Abnormal growths within the lining of the uterus (polyps). ?Benign tumors or growths in the uterus (fibroids). These are not cancer. ?Infection. ?Cancer. ?Too much or too little of some hormones in the body (hormonal imbalances). ?Any type of abnormal bleeding should be checked by a health care provider. Many cases are minor and simple to treat, but others may be more serious. Treatment will depend on the cause of the bleeding and how severe it is. ?Follow these instructions at home: ?Medicines ?Take over-the-counter and prescription medicines only as told by your health care provider. ?Ask your health care provider about: ?Taking medicines such as aspirin and ibuprofen. These medicines can thin your blood. Do not take these medicines unless your health care provider tells you to take them. ?Taking over-the-counter medicines, vitamins, herbs, and supplements. ?If you were prescribed iron pills, take them as  told by your health care provider. Iron pills help to replace iron that your body loses because of this condition. ?Managing constipation ?In cases of severe bleeding, you may be asked to increase your iron intake to treat anemia. Doing this may cause constipation. To prevent or treat constipation, you may need to: ?Drink enough fluid to keep your urine pale yellow. ?Take over-the-counter or prescription medicines. ?Eat foods that are high in fiber, such as beans, whole grains, and fresh fruits and vegetables. ?Limit foods that are high in fat and processed sugars, such as fried or sweet foods. ?Activity ?Alter your activity to decrease bleeding if you need to change your sanitary pad more than one time every 2 hours: ?Lie in bed with your feet raised (elevated). ?Place a cold pack on your lower abdomen. ?Rest as much as possible until the bleeding stops or slows down. ?General instructions ?Do not use tampons, douche, or have sex until your health care provider says these things are okay. ?Change your sanitary pads often. ?Get regular exams. These include pelvic exams and cervical cancer screenings. ?It is up to you to get the results of any tests that are done. Ask your health care provider, or the department that is doing the tests, when your results will be ready. ?Monitor your condition for any changes. For 2 months, write down: ?When your menstrual period starts. ?When your menstrual period ends. ?When any abnormal vaginal bleeding occurs. ?What problems you notice. ?Keep all follow-up visits. This is important. ?Contact a health care provider if: ?You have bleeding that lasts for more than one week. ?You feel dizzy at times. ?You feel nauseous or you vomit. ?You feel light-headed or weak. ?You notice  any other changes that show that your condition is getting worse. ?Get help right away if: ?You faint. ?You have bleeding that soaks through a sanitary pad every hour. ?You have pain in the abdomen. ?You have a  fever or chills. ?You become sweaty or weak. ?You pass large blood clots from your vagina. ?These symptoms may represent a serious problem that is an emergency. Do not wait to see if the symptoms will go away. Get medical help right away. Call your local emergency services (911 in the [place] Do not drive yourself to the hospital. ?Summary ?Abnormal uterine bleeding is unusual bleeding from the uterus. ?Any type of abnormal bleeding should be checked by a health care provider. Many cases are minor and simple to treat, but others may be more serious. ?Treatment will depend on the cause of the bleeding and how severe it is. ?Get help right away if you faint, you have bleeding that soaks through a sanitary pad every hour, or you pass large blood clots from your vagina. ?This information is not intended to replace advice given to you by your health care provider. Make sure you discuss any questions you have with your health care provider. ?Document Revised: 02/08/2021 Document Reviewed: 02/08/2021 ?Elsevier Patient Education ? [place]. ? ?

## 2022-01-30 NOTE — Progress Notes (Signed)
Patient last took BP medication yesterday. ?Patient has eaten today. ?Patient denies pain at this time. ? ?

## 2022-01-31 DIAGNOSIS — E785 Hyperlipidemia, unspecified: Secondary | ICD-10-CM | POA: Insufficient documentation

## 2022-01-31 DIAGNOSIS — L659 Nonscarring hair loss, unspecified: Secondary | ICD-10-CM | POA: Insufficient documentation

## 2022-01-31 DIAGNOSIS — N939 Abnormal uterine and vaginal bleeding, unspecified: Secondary | ICD-10-CM | POA: Insufficient documentation

## 2022-01-31 DIAGNOSIS — E6609 Other obesity due to excess calories: Secondary | ICD-10-CM | POA: Insufficient documentation

## 2022-01-31 LAB — COMP. METABOLIC PANEL (12)
AST: 20 IU/L (ref 0–40)
Albumin/Globulin Ratio: 1.3 (ref 1.2–2.2)
Albumin: 4.3 g/dL (ref 3.8–4.9)
Alkaline Phosphatase: 72 IU/L (ref 44–121)
BUN/Creatinine Ratio: 22 (ref 9–23)
BUN: 19 mg/dL (ref 6–24)
Bilirubin Total: 0.3 mg/dL (ref 0.0–1.2)
Calcium: 9.6 mg/dL (ref 8.7–10.2)
Chloride: 101 mmol/L (ref 96–106)
Creatinine, Ser: 0.88 mg/dL (ref 0.57–1.00)
Globulin, Total: 3.3 g/dL (ref 1.5–4.5)
Glucose: 98 mg/dL (ref 70–99)
Potassium: 3.9 mmol/L (ref 3.5–5.2)
Sodium: 139 mmol/L (ref 134–144)
Total Protein: 7.6 g/dL (ref 6.0–8.5)
eGFR: 79 mL/min/{1.73_m2} (ref >59)

## 2022-01-31 LAB — ANA W/REFLEX IF POSITIVE: Anti Nuclear Antibody (ANA): NEGATIVE

## 2022-01-31 LAB — CBC WITH DIFFERENTIAL/PLATELET
Basophils Absolute: 0 10*3/uL (ref 0.0–0.2)
Basos: 1 %
EOS (ABSOLUTE): 0.1 10*3/uL (ref 0.0–0.4)
Eos: 1 %
Hematocrit: 38.4 % (ref 34.0–46.6)
Hemoglobin: 13.1 g/dL (ref 11.1–15.9)
Immature Grans (Abs): 0 10*3/uL (ref 0.0–0.1)
Immature Granulocytes: 0 %
Lymphocytes Absolute: 1.9 10*3/uL (ref 0.7–3.1)
Lymphs: 36 %
MCH: 29.5 pg (ref 26.6–33.0)
MCHC: 34.1 g/dL (ref 31.5–35.7)
MCV: 87 fL (ref 79–97)
Monocytes Absolute: 0.4 10*3/uL (ref 0.1–0.9)
Monocytes: 7 %
Neutrophils Absolute: 3 10*3/uL (ref 1.4–7.0)
Neutrophils: 55 %
Platelets: 354 10*3/uL (ref 150–450)
RBC: 4.44 x10E6/uL (ref 3.77–5.28)
RDW: 13.5 % (ref 11.7–15.4)
WBC: 5.4 10*3/uL (ref 3.4–10.8)

## 2022-01-31 LAB — THYROID PANEL WITH TSH
Free Thyroxine Index: 1.3 (ref 1.2–4.9)
T3 Uptake Ratio: 28 % (ref 24–39)
T4, Total: 4.5 ug/dL (ref 4.5–12.0)
TSH: 1.23 u[IU]/mL (ref 0.450–4.500)

## 2022-01-31 LAB — ESTRADIOL: Estradiol: 25.6 pg/mL

## 2022-01-31 LAB — FSH/LH
FSH: 26 m[IU]/mL
LH: 17.3 m[IU]/mL

## 2022-02-03 ENCOUNTER — Telehealth: Payer: Self-pay | Admitting: *Deleted

## 2022-02-03 NOTE — Telephone Encounter (Signed)
-----   Message from Kennieth Rad, [place] sent at 02/01/2022 12:37 PM EDT ----- ?Please call patient and let her know that her thyroid function, kidney and liver function are within normal limits.  Her hormone levels do show that she is in a postmenopausal state.  Her testing for autoimmune was negative.  We are still waiting for her cholesterol panel to return. ?

## 2022-02-03 NOTE — Telephone Encounter (Signed)
Patient is aware of labs being normal and being in postmenopausal state. Patient started megace yesterday and will follow up after completion if bleeding does not stop. ?

## 2022-02-08 ENCOUNTER — Telehealth: Payer: Self-pay | Admitting: Nurse Practitioner

## 2022-02-08 ENCOUNTER — Ambulatory Visit: Payer: Self-pay

## 2022-02-08 NOTE — Telephone Encounter (Signed)
? ? ?  Chief Complaint: Pt. Was given Megace and states vaginal bleeding got worse ?Symptoms: "I had only been spotting but now it's a heavier." ?Frequency:  ?Pertinent Negatives: Patient denies Pain ?Disposition: '[]'$ ED /'[]'$ Urgent Care (no appt availability in office) / '[]'$ Appointment(In office/virtual)/ '[]'$  [facility]/ '[]'$ Home Care/ '[]'$ Refused Recommended Disposition /'[]'$ [facility]/ '[]'$  Follow-up with PCP ?Additional Notes: Pt. Asking what "can I do to stop this bleeding/" Please advise pt.  ?Answer Assessment - Initial Assessment Questions ?1. AMOUNT: "Describe the bleeding that you are having."  ?  - SPOTTING: spotting, or pinkish / brownish mucous discharge; does not fill panty liner or pad  ?  - MILD:  less than 1 pad / hour; less than patient's usual menstrual bleeding ?  - MODERATE: 1-2 pads / hour; 1 menstrual cup every 6 hours; small-medium blood clots (e.g., pea, grape, small coin) ?  - SEVERE: soaking 2 or more pads/hour for 2 or more hours; 1 menstrual cup every 2 hours; bleeding not contained by pads or continuous red blood from vagina; large blood clots (e.g., golf ball, large coin)  ?    Mild ?2. ONSET: "When did the bleeding begin?" "Is it continuing now?" ?    Started last month ?3. MENSTRUAL PERIOD: "When was the last normal menstrual period?" "How is this different than your period?" ?    October ?4. REGULARITY: "How regular are your periods?" ?    None ?5. ABDOMINAL PAIN: "Do you have any pain?" "How bad is the pain?"  (e.g., Scale 1-10; mild, moderate, or severe) ?  - MILD (1-3): doesn't interfere with normal activities, abdomen soft and not tender to touch  ?  - MODERATE (4-7): interferes with normal activities or awakens from sleep, abdomen tender to touch  ?  - SEVERE (8-10): excruciating pain, doubled over, unable to do any normal activities  ?    No ?6. PREGNANCY: "Could you be pregnant?" "Are you sexually active?" "Did you recently give birth?" ?    No ?7. BREASTFEEDING:  "Are you breastfeeding?" ?    No ?8. HORMONES: "Are you taking any hormone medications, prescription or OTC?" (e.g., birth control pills, estrogen) ?    No ?9. BLOOD THINNERS: "Do you take any blood thinners?" (e.g., Coumadin/warfarin, Pradaxa/dabigatran, aspirin) ?    No ?10. CAUSE: "What do you think is causing the bleeding?" (e.g., recent gyn surgery, recent gyn procedure; known bleeding disorder, cervical cancer, polycystic ovarian disease, fibroids)   ?      Taking the medicine they gave me 01/30/22 ?11. HEMODYNAMIC STATUS: "Are you weak or feeling lightheaded?" If Yes, ask: "Can you stand and walk normally?"  ?      No ?12. OTHER SYMPTOMS: "What other symptoms are you having with the bleeding?" (e.g., passed tissue, vaginal discharge, fever, menstrual-type cramps) ?      No ? ?Protocols used: Vaginal Bleeding - Abnormal-A-AH ? ?

## 2022-02-08 NOTE — Telephone Encounter (Signed)
Copied from [place] [id number]. Topic: General - Other ?>> Feb 08, 2022  4:46 PM Pawlus, Brayton Layman A wrote: ?Reason for CRM: Pt spoke with NT today 4/19 and requested a call back, please advise. ?

## 2022-02-09 NOTE — Telephone Encounter (Signed)
Please schedule patient with [facility] next week for vaginal bleeding

## 2022-02-11 ENCOUNTER — Other Ambulatory Visit: Payer: Self-pay | Admitting: Nurse Practitioner

## 2022-02-11 DIAGNOSIS — I10 Essential (primary) hypertension: Secondary | ICD-10-CM

## 2022-02-13 ENCOUNTER — Telehealth: Payer: Self-pay | Admitting: Pharmacist

## 2022-02-13 NOTE — Telephone Encounter (Signed)
Requested Prescriptions  ?Pending Prescriptions Disp Refills  ?? amLODipine (NORVASC) 10 MG tablet [Pharmacy Med Name: amLODIPine Besylate 10 MG Oral Tablet] 90 tablet 0  ?  Sig: Take 1 tablet by mouth once daily  ?  ? Cardiovascular: Calcium Channel Blockers 2 Failed - 02/11/2022  5:44 PM  ?  ?  Failed - Last BP in normal range  ?  BP Readings from Last 1 Encounters:  ?01/30/22 (!) 171/97  ?   ?  ?  Passed - Last Heart Rate in normal range  ?  Pulse Readings from Last 1 Encounters:  ?01/30/22 87  ?   ?  ?  Passed - Valid encounter within last 6 months  ?  Recent Outpatient Visits   ?      ? 2 weeks ago Abnormal uterine bleeding  ? [facility], PA-C  ? 3 months ago Essential hypertension  ? [place] Hubbell, [place] W, NP  ? 6 months ago Primary hypertension  ? [place], [place] W, NP  ? 8 months ago Essential hypertension  ? [place], RPH-CPP  ? 9 months ago Essential hypertension  ? [place] [name], NP  ?  ?  ?Future Appointments   ?        ? In 2 months [name], NP [facility]  ?  ? ?  ?  ?  ? ?

## 2022-02-13 NOTE — Telephone Encounter (Signed)
Patient appearing on report for [facility] - Hypertension Control report due to last documented ambulatory blood pressure of 171/97 on 01/30/22. Next appointment with PCP is 04/19/22  ? ?Outreached patient to discuss hypertension control and medication management. Left voicemail for patient to return my call at her convenience. Will also send MyChart message.  ? ?Catie Hedwig Morton, PharmD, BCACP ?[facility] ?[phone] ? ?

## 2022-02-22 ENCOUNTER — Ambulatory Visit: Payer: Self-pay

## 2022-02-22 NOTE — Telephone Encounter (Signed)
Patient called in to inform [name] that the megestrol (MEGACE) 20 MG tablet given to her did not help say that she took them and is still bleeding say that it seem to be worse. Per patient she think she need to be on some birthcontrol to help with the bleeding. Also wanted to know if the pre menopause is what is causing her hair loss.  Can be reached at Ph# [phone]  ? ?Mailbox is full, unable to leave a message. ?

## 2022-02-22 NOTE — Telephone Encounter (Signed)
?  Chief Complaint: birth control ?Symptoms: periods for 6 months, hair loss that has gotten bad ?Frequency: 6 months ?Pertinent Negatives: NA ?Disposition: '[]'$ ED /'[]'$ Urgent Care (no appt availability in office) / '[x]'$ Appointment(In office/virtual)/ '[]'$  [facility]/ '[]'$ Home Care/ '[]'$ Refused Recommended Disposition /'[]'$ [facility]/ '[]'$  Follow-up with PCP ?Additional Notes: pt calling in to request birth control pills to help with her periods and unsure if its her menopause or not. I advised her I would send a message to Zelda, NP and also scheduled VV for 03/07/22 to talk about symptoms as well. Pt states her hair loss has gotten worse to where she has a bald spot. I recommended her try some OTC Biotin as well.  ? ?Summary: Bleeding and hair loss  ? Patient called in to inform Tammy Barajas that the megestrol (MEGACE) 20 MG tablet given to her did not help say that she took them and is still bleeding say that it seem to be worse. Per patient she think she need to be on some birthcontrol to help with the bleeding. Also wanted to know if the pre menopause is what is causing her hair loss.  Can be reached at Ph# [phone]   ?  ? ?Reason for Disposition ? [1] Bleeding or spotting between regular periods AND [2] occurs more than three cycles (3 months) this past year ? ?Answer Assessment - Initial Assessment Questions ?1. AMOUNT: "Describe the bleeding that you are having."  ?  - SPOTTING: spotting, or pinkish / brownish mucous discharge; does not fill panty liner or pad  ?  - MILD:  less than 1 pad / hour; less than patient's usual menstrual bleeding ?  - MODERATE: 1-2 pads / hour; 1 menstrual cup every 6 hours; small-medium blood clots (e.g., pea, grape, small coin) ?  - SEVERE: soaking 2 or more pads/hour for 2 or more hours; 1 menstrual cup every 2 hours; bleeding not contained by pads or continuous red blood from vagina; large blood clots (e.g., golf ball, large coin)  ?    Mild to moderate at  times ?2. ONSET: "When did the bleeding begin?" "Is it continuing now?" ?    Been going on for 6 months ?8. HORMONES: "Are you taking any hormone medications, prescription or OTC?" (e.g., birth control pills, estrogen) ?    Been taking megace but hadn't helped ?9. BLOOD THINNERS: "Do you take any blood thinners?" (e.g., Coumadin/warfarin, Pradaxa/dabigatran, aspirin) ?    No ?12. OTHER SYMPTOMS: "What other symptoms are you having with the bleeding?" (e.g., passed tissue, vaginal discharge, fever, menstrual-type cramps) ?      Cramps and hair loss ? ?Protocols used: Vaginal Bleeding - Abnormal-A-AH ? ?

## 2022-02-22 NOTE — Telephone Encounter (Signed)
Noted patient can discuss concerns at upcoming visit. ?

## 2022-03-07 ENCOUNTER — Telehealth: Payer: Self-pay | Admitting: Nurse Practitioner

## 2022-03-07 NOTE — Telephone Encounter (Signed)
"  Call can not be completed as [phone]  ?

## 2022-04-19 ENCOUNTER — Ambulatory Visit: Payer: No Typology Code available for payment source | Attending: Nurse Practitioner | Admitting: Nurse Practitioner

## 2022-04-19 ENCOUNTER — Telehealth: Payer: Self-pay | Admitting: Nurse Practitioner

## 2022-04-19 ENCOUNTER — Encounter: Payer: Self-pay | Admitting: Nurse Practitioner

## 2022-04-19 VITALS — BP 173/97 | HR 84 | Temp 97.9°F | Resp 20 | Wt 247.0 lb

## 2022-04-19 DIAGNOSIS — I10 Essential (primary) hypertension: Secondary | ICD-10-CM

## 2022-04-19 DIAGNOSIS — Z1211 Encounter for screening for malignant neoplasm of colon: Secondary | ICD-10-CM

## 2022-04-19 DIAGNOSIS — E785 Hyperlipidemia, unspecified: Secondary | ICD-10-CM

## 2022-04-19 DIAGNOSIS — L659 Nonscarring hair loss, unspecified: Secondary | ICD-10-CM

## 2022-04-19 MED ORDER — HYDROCHLOROTHIAZIDE 25 MG PO TABS: 25.0000 mg | ORAL_TABLET | Freq: Every day | ORAL | 1 refills | Status: DC

## 2022-04-19 MED ORDER — AMLODIPINE BESYLATE 10 MG PO TABS: 10.0000 mg | ORAL_TABLET | Freq: Every day | ORAL | 1 refills | Status: DC

## 2022-04-19 NOTE — Patient Instructions (Signed)
Placed in [place], [place] [address] Ph# [phone]

## 2022-04-19 NOTE — Telephone Encounter (Signed)
Copied from [place]. Topic: Referral - Question >> Apr 19, 2022  5:05 PM Rudene Anda wrote: Reason for CRM: Pt wanted to know where her referral to dermatology was sent, please advise.

## 2022-04-19 NOTE — Progress Notes (Signed)
Assessment & Plan:  Tammy Barajas was seen today for hypertension.  Diagnoses and all orders for this visit:  Essential hypertension -     hydrochlorothiazide (HYDRODIURIL) 25 MG tablet; Take 1 tablet (25 mg total) by mouth daily. -     amLODipine (NORVASC) 10 MG tablet; Take 1 tablet (10 mg total) by mouth daily.  Colon cancer screening -     Fecal occult blood, imunochemical([facility])  Dyslipidemia, goal LDL below 100 -     Lipid panel  Alopecia -     Ambulatory referral to Dermatology    Patient has been counseled on age-appropriate routine health concerns for screening and prevention. These are reviewed and up-to-date. Referrals have been placed accordingly. Immunizations are up-to-date or declined.    Subjective:   Chief Complaint  Patient presents with   Hypertension   HPI Tammy Barajas 56 y.o. female presents to office today for follow up to HTN   Blood pressure is poorly controlled. She is not taking Amlodipine 10 mg daily and HCTZ 25 mg daily as prescribed.  She is not dietary adherent in regard to low sodium diet.  She adamantly declines any additional blood pressure agents today but states she will not take any new blood pressure medication.  BP Readings from Last 3 Encounters:  04/19/22 (!) 173/97  01/30/22 (!) 171/97  10/21/21 (!) 161/104     Alopecia She has a large bald spot adjacent to the left occipital area. She believes her vitamin D is causing this however it appears she may have more of an alopecia condition that is not related to her medications. She also has an area at the top of the scalp that is worsening and increased in size. She is wearing a weave which I have encouraged her to stop as this may be contributing to her alopecia.   Review of Systems  Constitutional:  Negative for fever, malaise/fatigue and weight loss.  HENT: Negative.  Negative for nosebleeds.   Eyes: Negative.  Negative for blurred vision, double vision and photophobia.   Respiratory: Negative.  Negative for cough and shortness of breath.   Cardiovascular: Negative.  Negative for chest pain, palpitations and leg swelling.  Gastrointestinal: Negative.  Negative for heartburn, nausea and vomiting.  Musculoskeletal: Negative.  Negative for myalgias.  Skin:        Alopecia  Neurological: Negative.  Negative for dizziness, focal weakness, seizures and headaches.  Psychiatric/Behavioral: Negative.  Negative for suicidal ideas.     Past Medical History:  Diagnosis Date   Hyperlipidemia    Hypertension     History reviewed. No pertinent surgical history.  Family History  Problem Relation Age of Onset   Hypertension Mother    Hypertension Brother    Healthy Father     Social History Reviewed with no changes to be made today.   Outpatient Medications Prior to Visit  Medication Sig Dispense Refill   Cholecalciferol (VITAMIN D3 PO) Take by mouth.     Multiple Vitamin (MULTIVITAMIN) tablet Take 1 tablet by mouth daily.     amLODipine (NORVASC) 10 MG tablet Take 1 tablet by mouth once daily 90 tablet 0   hydrochlorothiazide (HYDRODIURIL) 25 MG tablet Take 1 tablet (25 mg total) by mouth daily. 90 tablet 1   No facility-administered medications prior to visit.    No Known Allergies     Objective:    BP (!) 173/97 (BP Location: Left Arm, Patient Position: Sitting, Cuff Size: Large)   Pulse 84  Temp 97.9 F (36.6 C) (Oral)   Resp 20   Wt 247 lb (112 kg)   LMP 02/20/2022   SpO2 97%   BMI 38.69 kg/m  Wt Readings from Last 3 Encounters:  04/19/22 247 lb (112 kg)  01/30/22 245 lb (111.1 kg)  10/21/21 239 lb 4 oz (108.5 kg)    Physical Exam Vitals and nursing note reviewed.  Constitutional:      Appearance: She is well-developed.  HENT:     Head: Normocephalic and atraumatic.  Cardiovascular:     Rate and Rhythm: Normal rate and regular rhythm.     Heart sounds: Normal heart sounds. No murmur heard.    No friction rub. No gallop.   Pulmonary:     Effort: Pulmonary effort is normal. No tachypnea or respiratory distress.     Breath sounds: Normal breath sounds. No decreased breath sounds, wheezing, rhonchi or rales.  Chest:     Chest wall: No tenderness.  Abdominal:     General: Bowel sounds are normal.     Palpations: Abdomen is soft.  Musculoskeletal:        General: Normal range of motion.     Cervical back: Normal range of motion.  Skin:    General: Skin is warm and dry.  Neurological:     Mental Status: She is alert and oriented to person, place, and time.     Coordination: Coordination normal.  Psychiatric:        Behavior: Behavior normal. Behavior is cooperative.        Thought Content: Thought content normal.        Judgment: Judgment normal.          Patient has been counseled extensively about nutrition and exercise as well as the importance of adherence with medications and regular follow-up. The patient was given clear instructions to go to ER or return to medical center if symptoms don't improve, worsen or new problems develop. The patient verbalized understanding.   Follow-up: Return in about 3 months (around 07/20/2022).   [name], FNP-BC [facility] and [facility] [place], [place]   04/21/2022, 12:50 PM

## 2022-04-21 NOTE — Telephone Encounter (Signed)
Attempted to call patient regarding Dermatology referral. Voicemail is full. UTR.    Per Referrals department Pt do not have insurance I send a letter to patient to apply for [place] with application to be refer to a [facility] with [facility] card

## 2022-06-12 ENCOUNTER — Telehealth: Payer: Self-pay | Admitting: Nurse Practitioner

## 2022-06-12 NOTE — Telephone Encounter (Signed)
Error

## 2022-07-03 ENCOUNTER — Encounter: Payer: Self-pay | Admitting: Family Medicine

## 2022-07-03 NOTE — Progress Notes (Signed)
Patient did not keep appointment today. She may call to reschedule.  

## 2022-07-10 ENCOUNTER — Ambulatory Visit (INDEPENDENT_AMBULATORY_CARE_PROVIDER_SITE_OTHER): Payer: Self-pay

## 2022-07-10 ENCOUNTER — Ambulatory Visit: Payer: Self-pay

## 2022-07-10 ENCOUNTER — Encounter (HOSPITAL_COMMUNITY): Payer: Self-pay

## 2022-07-10 ENCOUNTER — Ambulatory Visit (HOSPITAL_COMMUNITY)
Admission: EM | Admit: 2022-07-10 | Discharge: 2022-07-10 | Disposition: A | Discharge status: Home / Self Care | Payer: Self-pay | Attending: Emergency Medicine | Admitting: Emergency Medicine

## 2022-07-10 DIAGNOSIS — M7989 Other specified soft tissue disorders: Secondary | ICD-10-CM

## 2022-07-10 DIAGNOSIS — M79671 Pain in right foot: Secondary | ICD-10-CM

## 2022-07-10 MED ORDER — ACETAMINOPHEN 325 MG PO TABS
650.0000 mg | ORAL_TABLET | Freq: Once | ORAL | Status: AC
  Administered 2022-07-10: 650 mg via ORAL

## 2022-07-10 MED ORDER — ACETAMINOPHEN 325 MG PO TABS
ORAL_TABLET | ORAL | Status: AC
  Filled 2022-07-10: qty 2

## 2022-07-10 NOTE — ED Provider Notes (Signed)
[place]    CSN: [id number] Arrival date & time: 07/10/22  1459     History   Chief Complaint Chief Complaint  Patient presents with   Foot Injury    HPI Tammy Barajas is a 56 y.o. female.  Presents with 3 day history of right foot pain. Reports started Saturday after walking around in sandals. Saturday was worst day of pain, getting better over the last two days. Has been using ice, tylenol, elevating, soaking  Swelling of right foot, tender when walking  Past Medical History:  Diagnosis Date   Hyperlipidemia    Hypertension     Patient Active Problem List   Diagnosis Date Noted   Abnormal uterine bleeding 01/31/2022   Hyperlipidemia 01/31/2022   Alopecia 01/31/2022   Class 2 obesity due to excess calories without serious comorbidity with body mass index (BMI) of 38.0 to 38.9 in adult 01/31/2022   Essential hypertension 07/20/2016    History reviewed. No pertinent surgical history.  OB History     Gravida  3   Para      Term      Preterm      AB  3   Living  0      SAB  3   IAB      Ectopic      Multiple      Live Births               Home Medications    Prior to Admission medications   Medication Sig Start Date End Date Taking? Authorizing Provider  amLODipine (NORVASC) 10 MG tablet Take 1 tablet (10 mg total) by mouth daily. 04/19/22   [name], NP  Cholecalciferol (VITAMIN D3 PO) Take by mouth.    [provider]  hydrochlorothiazide (HYDRODIURIL) 25 MG tablet Take 1 tablet (25 mg total) by mouth daily. 04/19/22   [name], NP  Multiple Vitamin (MULTIVITAMIN) tablet Take 1 tablet by mouth daily.    [provider]    Family History Family History  Problem Relation Age of Onset   Hypertension Mother    Hypertension Brother    Healthy Father     Social History Social History   Tobacco Use   Smoking status: Never   Smokeless tobacco: Never  Vaping Use   Vaping Use: Never  used  Substance Use Topics   Alcohol use: Yes    Comment: occassionally   Drug use: Yes    Frequency: 3.0 times per week    Types: Marijuana    Comment: last use 05/09/15     Allergies   Patient has no known allergies.   Review of Systems Review of Systems Per HPI  Physical Exam Triage Vital Signs ED Triage Vitals [07/10/22 1646]  Enc Vitals Group     BP (!) 193/134     Pulse Rate 87     Resp 18     Temp 98.2 F (36.8 C)     Temp Source Oral     SpO2 98 %     Weight      Height      Head Circumference      Peak Flow      Pain Score      Pain Loc      Pain Edu?      Excl. in [place]?    No data found.  Updated Vital Signs BP (!) 174/117 (BP Location: Left Arm)   Pulse  87   Temp 98.2 F (36.8 C) (Oral)   Resp 18   SpO2 98%    Physical Exam Vitals and nursing note reviewed.  Constitutional:      General: She is not in acute distress.    Appearance: Normal appearance.  Cardiovascular:     Rate and Rhythm: Normal rate and regular rhythm.     Pulses: Normal pulses.     Heart sounds: Normal heart sounds.  Pulmonary:     Effort: Pulmonary effort is normal.     Breath sounds: Normal breath sounds.  Musculoskeletal:     Right foot: Normal range of motion. Swelling and tenderness present. No foot drop.  Feet:     Comments: Right midfoot swollen, not red or hot to touch. Tender right big toe dorsally and medially, some midfoot. Sensation intact and cap refill < 2 seconds Skin:    General: Skin is warm and dry.     Capillary Refill: Capillary refill takes less than 2 seconds.  Neurological:     Mental Status: She is alert.    UC Treatments / Results  Labs (all labs ordered are listed, but only abnormal results are displayed) Labs Reviewed - No data to display  EKG  Radiology DG Foot Complete Right  Result Date: 07/10/2022 CLINICAL DATA:  Right foot pain, no known injury EXAM: RIGHT FOOT COMPLETE - 3+ VIEW COMPARISON:  None Available. FINDINGS: There is  no evidence of fracture or dislocation. There is no evidence of arthropathy or other focal bone abnormality. Soft tissues are unremarkable. IMPRESSION: Negative. Electronically Signed   By: Merilyn Baba M.D.   On: 07/10/2022 17:48    Procedures Procedures  Medications Ordered in UC Medications  acetaminophen (TYLENOL) tablet 650 mg (650 mg Oral Given 07/10/22 1755)    Initial Impression / Assessment and Plan / UC Course  I have reviewed the triage vital signs and the nursing notes.  Pertinent labs & imaging results that were available during my care of the patient were reviewed by me and considered in my medical decision making (see chart for details).  Tylenol dose given for pain.  BP elevated, patient reports she has not taken her BP meds in a few days. No red flag symptoms. Discussed taking meds as prescribed, follow up with PCP if not controlled.  Right foot xray negative. Likely pain/swelling from being on her feet, non supportive shoes. Reassuring that pain improving, recommend continue what she's doing at home and try to elevate foot, avoid standing for long periods of time. Provided work note. Return precautions discussed. Patient agrees to plan  Final Clinical Impressions(s) / UC Diagnoses   Final diagnoses:  Right foot pain  Swelling of right foot     Discharge Instructions      Your x-ray was negative. You likely have some inflammation in the foot from walking/not wearing supportive shoes. I recommend continuing what you are doing, Tylenol as needed every 6 hours, applying ice for 20 minutes at a time, elevating the extremity.  I have provided a work note for you to be off your feet for the next few days.  Please follow-up with the orthopedic specialist if your symptoms persist after a week despite intervention.  Please take your blood pressure medication as prescribed, and follow up with your primary care provider as needed.     ED Prescriptions   None     PDMP not reviewed this encounter.   Kyra Leyland 07/10/22 9357

## 2022-07-10 NOTE — ED Triage Notes (Signed)
Pt reports right foot pain x 3-4 days. No known injury or trauma. Pt reports doing a lot of walking this weekend.

## 2022-07-10 NOTE — Telephone Encounter (Signed)
Routing to CMA 

## 2022-07-10 NOTE — Discharge Instructions (Addendum)
Your x-ray was negative. You likely have some inflammation in the foot from walking/not wearing supportive shoes. I recommend continuing what you are doing, Tylenol as needed every 6 hours, applying ice for 20 minutes at a time, elevating the extremity.  I have provided a work note for you to be off your feet for the next few days.  Please follow-up with the orthopedic specialist if your symptoms persist after a week despite intervention.  Please take your blood pressure medication as prescribed, and follow up with your primary care provider as needed.

## 2022-07-10 NOTE — Telephone Encounter (Signed)
Summary: swollen foot   Pt states her rt toe and foot is swollen/ painful  x3d and she can not put a shoe on   Pt has tried soaking foot in episome salt and icing it   Pt requesting to see provider today, the next appt is 11-07   Please fu w/ pt      Chief Complaint: Rt great toe swollen red and painful. Rt foot is swollen Symptoms: above Frequency: Since last Friday Pertinent Negatives: Patient denies Fever Disposition: '[]'$ ED /'[x]'$ Urgent Care (no appt availability in office) / '[]'$ Appointment(In office/virtual)/ '[]'$  [facility]/ '[]'$ Home Care/ '[]'$ Refused Recommended Disposition /'[]'$ [facility]/ '[]'$  Follow-up with PCP Additional Notes: PT went to the [facility] and walked  a lot a week ago Friday. Pt noticed that foot was swollen and painful. PT wore sandals to [place] is quite swollen, she cannot wear her tennis shoes, she is wearing her house shoes. Right great toe is swollen and painful.   Pt also needs to go to gyn as vaginal bleeding has resumed. She also needs a referral for a dermatologist.   PT states that she filled out her insurance paperwork, but has not hears back regarding this.   Please advise.    Reason for Disposition  [1] MODERATE pain (e.g., interferes with normal activities, limping) AND [2] present > 3 days  Answer Assessment - Initial Assessment Questions 1. ONSET: "When did the pain start?"      1 week 2. LOCATION: "Where is the pain located?"      Right foot - big toe 3. PAIN: "How bad is the pain?"    (Scale 1-10; or mild, moderate, severe)  - MILD (1-3): doesn't interfere with normal activities.   - MODERATE (4-7): interferes with normal activities (e.g., work or school) or awakens from sleep, limping.   - SEVERE (8-10): excruciating pain, unable to do any normal activities, unable to walk.      moderate 4. WORK OR EXERCISE: "Has there been any recent work or exercise that involved this part of the body?"      Walked a lot last  friday 5. CAUSE: "What do you think is causing the foot pain?"     Too much walking 6. OTHER SYMPTOMS: "Do you have any other symptoms?" (e.g., leg pain, rash, fever, numbness)     swollen 7. PREGNANCY: "Is there any chance you are pregnant?" "When was your last menstrual period?"     na  Protocols used: Foot Pain-A-AH

## 2022-07-11 NOTE — Telephone Encounter (Signed)
Call returned. Patient will keep 9/29 appt.

## 2022-07-21 ENCOUNTER — Encounter: Payer: Self-pay | Admitting: Nurse Practitioner

## 2022-07-21 ENCOUNTER — Ambulatory Visit: Payer: Self-pay | Attending: Nurse Practitioner | Admitting: Nurse Practitioner

## 2022-07-21 VITALS — BP 165/109 | HR 73 | Temp 98.0°F | Ht 67.0 in | Wt 242.0 lb

## 2022-07-21 DIAGNOSIS — E785 Hyperlipidemia, unspecified: Secondary | ICD-10-CM

## 2022-07-21 DIAGNOSIS — M7989 Other specified soft tissue disorders: Secondary | ICD-10-CM

## 2022-07-21 DIAGNOSIS — I10 Essential (primary) hypertension: Secondary | ICD-10-CM

## 2022-07-21 MED ORDER — PREDNISONE 20 MG PO TABS: 20.0000 mg | ORAL_TABLET | Freq: Every day | ORAL | 0 refills | Status: DC

## 2022-07-21 NOTE — Progress Notes (Unsigned)
Right swollen foot.

## 2022-07-21 NOTE — Progress Notes (Unsigned)
   Assessment & Plan:  [name] was seen today for hypertension.  Diagnoses and all orders for this visit:  Swelling of right foot    Patient has been counseled on age-appropriate routine health concerns for screening and prevention. These are reviewed and up-to-date. Referrals have been placed accordingly. Immunizations are up-to-date or declined.    Subjective:   Chief Complaint  Patient presents with   Hypertension   HPI Tammy Barajas 56 y.o. female presents to office today for follow up to HTN   Started having strange sensation in jaw when she took amlodipine and hctz together. BP Readings from Last 3 Encounters:  07/21/22 (!) 165/109  07/10/22 (!) 174/117  04/19/22 (!) 173/97     [employer] 2 weeks ago.    ROS  Past Medical History:  Diagnosis Date   Hyperlipidemia    Hypertension     No past surgical history on file.  Family History  Problem Relation Age of Onset   Hypertension Mother    Hypertension Brother    Healthy Father     Social History Reviewed with no changes to be made today.   Outpatient Medications Prior to Visit  Medication Sig Dispense Refill   amLODipine (NORVASC) 10 MG tablet Take 1 tablet (10 mg total) by mouth daily. 90 tablet 1   hydrochlorothiazide (HYDRODIURIL) 25 MG tablet Take 1 tablet (25 mg total) by mouth daily. 90 tablet 1   Multiple Vitamin (MULTIVITAMIN) tablet Take 1 tablet by mouth daily.     Cholecalciferol (VITAMIN D3 PO) Take by mouth. (Patient not taking: Reported on 07/21/2022)     No facility-administered medications prior to visit.    No Known Allergies     Objective:    BP (!) 165/109   Pulse 73   Temp 98 F (36.7 C) (Oral)   Ht '5\' 7"'$  (1.702 m)   Wt 242 lb (109.8 kg)   SpO2 99%   BMI 37.90 kg/m  Wt Readings from Last 3 Encounters:  07/21/22 242 lb (109.8 kg)  04/19/22 247 lb (112 kg)  01/30/22 245 lb (111.1 kg)    Physical Exam       Patient has been counseled extensively about  nutrition and exercise as well as the importance of adherence with medications and regular follow-up. The patient was given clear instructions to go to ER or return to medical center if symptoms don't improve, worsen or new problems develop. The patient verbalized understanding.   Follow-up: No follow-ups on file.   [name], FNP-BC [facility] and [place], [place]   07/21/2022, 4:10 PM

## 2022-07-22 LAB — CMP14+EGFR
ALT: 14 IU/L (ref 0–32)
AST: 16 IU/L (ref 0–40)
Albumin/Globulin Ratio: 1.1 — ABNORMAL LOW (ref 1.2–2.2)
Albumin: 4.2 g/dL (ref 3.8–4.9)
Alkaline Phosphatase: 77 IU/L (ref 44–121)
BUN/Creatinine Ratio: 14 (ref 9–23)
BUN: 16 mg/dL (ref 6–24)
Bilirubin Total: 0.5 mg/dL (ref 0.0–1.2)
CO2: 23 mmol/L (ref 20–29)
Calcium: 9.8 mg/dL (ref 8.7–10.2)
Chloride: 103 mmol/L (ref 96–106)
Creatinine, Ser: 1.11 mg/dL — ABNORMAL HIGH (ref 0.57–1.00)
Globulin, Total: 3.7 g/dL (ref 1.5–4.5)
Glucose: 81 mg/dL (ref 70–99)
Potassium: 4.1 mmol/L (ref 3.5–5.2)
Sodium: 142 mmol/L (ref 134–144)
Total Protein: 7.9 g/dL (ref 6.0–8.5)
eGFR: 59 mL/min/{1.73_m2} — ABNORMAL LOW (ref >59)

## 2022-07-22 LAB — CBC WITH DIFFERENTIAL/PLATELET
Basophils Absolute: 0 10*3/uL (ref 0.0–0.2)
Basos: 1 %
EOS (ABSOLUTE): 0 10*3/uL (ref 0.0–0.4)
Eos: 1 %
Hematocrit: 40.6 % (ref 34.0–46.6)
Hemoglobin: 13.4 g/dL (ref 11.1–15.9)
Immature Grans (Abs): 0 10*3/uL (ref 0.0–0.1)
Immature Granulocytes: 0 %
Lymphocytes Absolute: 1.8 10*3/uL (ref 0.7–3.1)
Lymphs: 35 %
MCH: 29 pg (ref 26.6–33.0)
MCHC: 33 g/dL (ref 31.5–35.7)
MCV: 88 fL (ref 79–97)
Monocytes Absolute: 0.5 10*3/uL (ref 0.1–0.9)
Monocytes: 10 %
Neutrophils Absolute: 2.8 10*3/uL (ref 1.4–7.0)
Neutrophils: 53 %
Platelets: 362 10*3/uL (ref 150–450)
RBC: 4.62 x10E6/uL (ref 3.77–5.28)
RDW: 12.9 % (ref 11.7–15.4)
WBC: 5.2 10*3/uL (ref 3.4–10.8)

## 2022-07-22 LAB — LIPID PANEL
Chol/HDL Ratio: 3.8 ratio (ref 0.0–4.4)
Cholesterol, Total: 218 mg/dL — ABNORMAL HIGH (ref 100–199)
HDL: 58 mg/dL (ref >39)
LDL Chol Calc (NIH): 141 mg/dL — ABNORMAL HIGH (ref 0–99)
Triglycerides: 109 mg/dL (ref 0–149)
VLDL Cholesterol Cal: 19 mg/dL (ref 5–40)

## 2022-07-22 LAB — BRAIN NATRIURETIC PEPTIDE: BNP: 16.9 pg/mL (ref 0.0–100.0)

## 2022-07-22 LAB — URIC ACID: Uric Acid: 7.3 mg/dL — ABNORMAL HIGH (ref 3.0–7.2)

## 2022-07-25 ENCOUNTER — Telehealth: Payer: Self-pay | Admitting: Emergency Medicine

## 2022-07-25 ENCOUNTER — Encounter: Payer: Self-pay | Admitting: Nurse Practitioner

## 2022-07-25 NOTE — Telephone Encounter (Signed)
Copied from [place] [phone]. Topic: General - Other >> Jul 25, 2022  2:34 PM Ja-Kwan M wrote: Reason for CRM: Pt stated she had a missed call from the office so she was returning the call. Pt requests call back. Cb# [phone] >> Jul 25, 2022  4:31 PM Cyndi Bender wrote: Pt called back and requested her lab results. Cb# [phone]

## 2022-07-27 NOTE — Telephone Encounter (Signed)
Patient aware of results and voiced understanding.

## 2022-08-19 ENCOUNTER — Inpatient Hospital Stay: Admission: RE | Admit: 2022-08-19 | Payer: Self-pay | Source: Ambulatory Visit

## 2022-08-21 ENCOUNTER — Ambulatory Visit: Payer: Self-pay | Attending: Nurse Practitioner

## 2022-08-21 ENCOUNTER — Other Ambulatory Visit (HOSPITAL_BASED_OUTPATIENT_CLINIC_OR_DEPARTMENT_OTHER): Payer: Self-pay

## 2022-08-21 ENCOUNTER — Ambulatory Visit: Payer: Self-pay

## 2022-08-21 DIAGNOSIS — Z111 Encounter for screening for respiratory tuberculosis: Secondary | ICD-10-CM

## 2022-08-21 NOTE — Addendum Note (Signed)
Addended by: Gasper Lloyd on: 08/21/2022 04:59 PM   Modules accepted: Orders

## 2022-08-23 ENCOUNTER — Other Ambulatory Visit: Payer: Self-pay | Admitting: Nurse Practitioner

## 2022-08-23 ENCOUNTER — Ambulatory Visit: Payer: Self-pay | Attending: Nurse Practitioner

## 2022-08-23 LAB — TB SKIN TEST
Induration: 0 mm
TB Skin Test: NEGATIVE

## 2022-08-23 MED ORDER — NAPROXEN 500 MG PO TABS: 500.0000 mg | ORAL_TABLET | Freq: Two times a day (BID) | ORAL | 0 refills | Status: DC

## 2022-08-23 NOTE — Addendum Note (Signed)
Addended by: Gasper Lloyd on: 08/23/2022 05:01 PM   Modules accepted: Orders

## 2022-08-23 NOTE — Progress Notes (Signed)
Pt presented in office for a skin tb test. Pt was given test in right forearm. Advised to return in 48 to 72 hours.

## 2022-08-23 NOTE — Progress Notes (Signed)
S:     PCP: [name]  56 y.o. female who presents for hypertension evaluation, education, and management. PMH is significant for HTN and HLD.   Patient was referred and last seen by Primary Care Provider, NP [name], on 9/29. At that visit, her BP was elevated. She reported intolerance to numerous BP medications and that she will alternate daily which single BP medication she will take.  Today, patient arrives in good spirits and presents without assistance.  Denies dizziness, headache, blurred vision, swelling. She reported that she took her BP medication (possibly just one single one) on her birthday last month. I was unable to get a clear answer otherwise on when she takes her medications. She stated at one point that the last time she took her medications prior was in September, however, her story later changed in the visit. She reports various side effects/intolerances to medications. Additionally, she later took off her shoe and sock to show the inflammation in her right big toe, complaining of pain.   She is very hesitant to take any medications for BP. She stated she does not want to be on 2 medications and refuses to take anything that would be considered a "high dose." She states she will only take medications if she feels bad.   Family / Social history:  - FHx: HTN (mother, brother), reported today- MI (sister) and CVA (mother) - Tobacco: never smoker - Alcohol: frequently - Illicit substances: smokes marijuana occasionally   Medication non-adherent . Patient has not taken BP medications today.   Current antihypertensives include: amlodipine 10 mg once daily, HCTZ  '25mg'$  once daily Antihypertensives tried in the past include: lisinopril (made her sick)  Reported home BP readings: not checking at home  Patient reported dietary habits & exercise:  - reported to be working on her diet, limiting her salt, and overall increasing lifestyle   O:  Vitals:   08/24/22  1608  BP: (!) 193/125  Pulse: 81     Last 3 Office BP readings: BP Readings from Last 3 Encounters:  07/21/22 (!) 165/109  07/10/22 (!) 174/117  04/19/22 (!) 173/97    BMET    Component Value Date/Time   NA 142 07/21/2022 1633   K 4.1 07/21/2022 1633   CL 103 07/21/2022 1633   CO2 23 07/21/2022 1633   GLUCOSE 81 07/21/2022 1633   GLUCOSE 92 05/10/2015 1319   BUN 16 07/21/2022 1633   CREATININE 1.11 (H) 07/21/2022 1633   CREATININE 1.07 05/10/2015 1319   CALCIUM 9.8 07/21/2022 1633   GFRNONAA 66 08/24/2020 1710   GFRNONAA 71 07/03/2014 1234   GFRAA 76 08/24/2020 1710   GFRAA 82 07/03/2014 1234    Renal function: CrCl cannot be calculated (Patient's most recent lab result is older than the maximum 21 days allowed.).  Clinical ASCVD: No  The 10-year ASCVD risk score (Arnett DK, et al., 2019) is: 11.4%   Values used to calculate the score:     Age: 21 years     Sex: Female     Is Non-Hispanic African American: Yes     Diabetic: No     Tobacco smoker: No     Systolic Blood Pressure: 774 mmHg     Is BP treated: Yes     HDL Cholesterol: 58 mg/dL     Total Cholesterol: 218 mg/dL    A/P: Hypertension diagnosed currently uncontrolled on current medications. BP goal < 130/80 mmHg. Medication adherence appears minimal, if at all.  She does have an elevated uric acid level and some symptoms consistent with a possible gout attack. Will discontinue HCTZ as this can exacerbate symptoms, along with the fact that refuses to take this medication. -Continued amlodipine '10mg'$  once daily.  -Discontinued HCTZ -Initiated losartan '25mg'$  once daily. She stated she does not intend to start this medication.  -Educated to take her BP medications at night to hopefully minimize side effects. -Patient educated on purpose, proper use, and potential adverse effects of losartan -Counseled on lifestyle modifications for blood pressure control including reduced dietary sodium, increased exercise,  adequate sleep. -Encouraged patient to check BP at home and bring log of readings to next visit. Counseled on proper use of home BP cuff.  -Discussed in depth the concerns for high blood pressure over time.  -Encouraged her to follow up with pharmacy if she is able to be compliant with her medications.    Results reviewed and written information provided.    Written patient instructions provided. Patient verbalized understanding of treatment plan.  Total time in face to face counseling 15 minutes.    Follow-up:  PCP clinic visit in January  Maryan Puls, PharmD PGY-1 [facility] Resident

## 2022-08-24 ENCOUNTER — Ambulatory Visit (HOSPITAL_BASED_OUTPATIENT_CLINIC_OR_DEPARTMENT_OTHER): Payer: Self-pay | Admitting: Pharmacist

## 2022-08-24 VITALS — BP 193/125 | HR 81

## 2022-08-24 DIAGNOSIS — I10 Essential (primary) hypertension: Secondary | ICD-10-CM

## 2022-08-24 MED ORDER — LOSARTAN POTASSIUM 25 MG PO TABS: 25.0000 mg | ORAL_TABLET | Freq: Every day | ORAL | 0 refills | Status: DC

## 2022-09-18 ENCOUNTER — Other Ambulatory Visit: Payer: Self-pay | Admitting: *Deleted

## 2022-09-18 DIAGNOSIS — Z1231 Encounter for screening mammogram for malignant neoplasm of breast: Secondary | ICD-10-CM

## 2022-09-21 ENCOUNTER — Ambulatory Visit
Admission: RE | Admit: 2022-09-21 | Discharge: 2022-09-21 | Disposition: A | Discharge status: Home / Self Care | Payer: No Typology Code available for payment source | Source: Ambulatory Visit | Attending: Nurse Practitioner | Admitting: Nurse Practitioner

## 2022-09-21 DIAGNOSIS — Z1231 Encounter for screening mammogram for malignant neoplasm of breast: Secondary | ICD-10-CM

## 2022-09-23 ENCOUNTER — Other Ambulatory Visit: Payer: Self-pay | Admitting: Nurse Practitioner

## 2022-09-23 DIAGNOSIS — R928 Other abnormal and inconclusive findings on diagnostic imaging of breast: Secondary | ICD-10-CM

## 2022-09-25 NOTE — Telephone Encounter (Signed)
Requested Prescriptions  Pending Prescriptions Disp Refills   naproxen (NAPROSYN) 500 MG tablet [Pharmacy Med Name: Naproxen 500 MG Oral Tablet] 30 tablet 0    Sig: TAKE 1 TABLET BY MOUTH TWICE DAILY WITH A MEAL FOR GOUT FLARE     Analgesics:  NSAIDS Failed - 09/23/2022  4:59 PM      Failed - Manual Review: Labs are only required if the patient has taken medication for more than 8 weeks.      Failed - Cr in normal range and within 360 days    Creat  Date Value Ref Range Status  05/10/2015 1.07 0.50 - 1.10 mg/dL Final   Creatinine, Ser  Date Value Ref Range Status  07/21/2022 1.11 (H) 0.57 - 1.00 mg/dL Final   Creatinine,U  Date Value Ref Range Status  05/18/2009  mg/dL Final   46.3 (NOTE)  Cutoff Values for Urine Drug Screen:        Drug Class           Cutoff (ng/mL)        Amphetamines            1000        Barbiturates             200        Cocaine Metabolites      300        Benzodiazepines          200        Methadone                 300        Opiates                 2000        Phencyclidine             25        Propoxyphene             300        Marijuana Metabolites     50  For medical purposes only.         Passed - HGB in normal range and within 360 days    Hemoglobin  Date Value Ref Range Status  07/21/2022 13.4 11.1 - 15.9 g/dL Final         Passed - PLT in normal range and within 360 days    Platelets  Date Value Ref Range Status  07/21/2022 362 150 - 450 x10E3/uL Final         Passed - HCT in normal range and within 360 days    Hematocrit  Date Value Ref Range Status  07/21/2022 40.6 34.0 - 46.6 % Final         Passed - eGFR is 30 or above and within 360 days    GFR, Est African American  Date Value Ref Range Status  07/03/2014 82 mL/min Final   GFR calc Af Amer  Date Value Ref Range Status  08/24/2020 76 >59 mL/min/1.73 Final    Comment:    **In accordance with recommendations from the NKF-ASN Task force,**   [facility] is in the process of  updating its eGFR calculation to the   2021 CKD-EPI creatinine equation that estimates kidney function   without a race variable.    GFR, Est Non African American  Date Value Ref Range Status  07/03/2014 71 mL/min Final    Comment:        The estimated GFR is a calculation valid for adults (>=30 years old) that uses the CKD-EPI algorithm to adjust for age and sex. It is   not to be used for children, pregnant women, hospitalized patients,    patients on dialysis, or with rapidly changing kidney function. According to the NKDEP, eGFR >89 is normal, 60-89 shows mild impairment, 30-59 shows moderate impairment, 15-29 shows severe impairment and <15 is ESRD.     GFR calc non Af Amer  Date Value Ref Range Status  08/24/2020 66 >59 mL/min/1.73 Final   eGFR  Date Value Ref Range Status  07/21/2022 59 (L) >59 mL/min/1.73 Final         Passed - Patient is not pregnant      Passed - Valid encounter within last 12 months    Recent Outpatient Visits           1 month ago Essential hypertension   [place], RPH-CPP   2 months ago Essential hypertension   [place], Vernia Buff, NP   5 months ago Essential hypertension   [place], NP   7 months ago Abnormal uterine bleeding   [facility], Cari S, PA-C   11 months ago Essential hypertension   [place], Vernia Buff, NP       Future Appointments             In 4 weeks [name], NP [place]

## 2022-09-27 ENCOUNTER — Telehealth: Payer: Self-pay | Admitting: *Deleted

## 2022-09-27 NOTE — Telephone Encounter (Signed)
Result note read to pt, verbalizes understanding.  "Needs diagnostic mammogram and [place] of right breast due to abnormal mammogram of the right breast. The mammogram center will call you to schedule."

## 2022-10-24 ENCOUNTER — Ambulatory Visit: Payer: No Typology Code available for payment source | Attending: Nurse Practitioner | Admitting: Nurse Practitioner

## 2022-10-24 ENCOUNTER — Other Ambulatory Visit: Payer: Self-pay

## 2022-10-24 ENCOUNTER — Encounter: Payer: Self-pay | Admitting: Nurse Practitioner

## 2022-10-24 VITALS — BP 183/108 | HR 75 | Ht 67.0 in | Wt 246.2 lb

## 2022-10-24 DIAGNOSIS — I10 Essential (primary) hypertension: Secondary | ICD-10-CM

## 2022-10-24 DIAGNOSIS — M10271 Drug-induced gout, right ankle and foot: Secondary | ICD-10-CM

## 2022-10-24 DIAGNOSIS — N939 Abnormal uterine and vaginal bleeding, unspecified: Secondary | ICD-10-CM

## 2022-10-24 MED ORDER — LOSARTAN POTASSIUM 25 MG PO TABS
25.0000 mg | ORAL_TABLET | Freq: Every day | ORAL | 1 refills | Status: DC
  Filled 2022-10-24: qty 30, 30d supply, fill #0

## 2022-10-24 NOTE — Progress Notes (Signed)
Assessment & Plan:  Tammy Barajas was seen today for hypertension.  Diagnoses and all orders for this visit:  Primary hypertension -     CMP14+EGFR Continue amlodipine and losartan as prescribed.  Reminded to bring in blood pressure log for follow  up appointment.  RECOMMENDATIONS: DASH/Mediterranean Diets are healthier choices for HTN.    Acute drug-induced gout of right foot -     Uric Acid  Abnormal uterine bleeding -     Ambulatory referral to Gynecology    Patient has been counseled on age-appropriate routine health concerns for screening and prevention. These are reviewed and up-to-date. Referrals have been placed accordingly. Immunizations are up-to-date or declined.    Subjective:   Chief Complaint  Patient presents with   Hypertension   HPI Tammy Barajas 57 y.o. female presents to office today for follow up to HTN.  She has a past medical history of medication non adherence Hyperlipidemia and Hypertension.     HTN Blood pressure is elevated. She reports numerous intolerances to blood pressure medication and does not want to be on high dose medications. Lisinopril made her sick and HCTZ was dc'd due to gout symptoms and elevated URIC acid level. Losartan was started a few months ago by the pharmacist however she never started that medication as she believed it would cause a reaction when taken with amlodipine. We had a long discussion today. She will begin taking her losartan and continue amlodipine. I did instruct her I am concerned she may have a stroke if we can not get her blood pressure managed.  BP Readings from Last 3 Encounters:  10/24/22 (!) 183/108  08/24/22 (!) 193/125  07/21/22 (!) 165/109       Review of Systems  Constitutional:  Negative for fever, malaise/fatigue and weight loss.  HENT: Negative.  Negative for nosebleeds.   Eyes: Negative.  Negative for blurred vision, double vision and photophobia.  Respiratory: Negative.  Negative for cough and  shortness of breath.   Cardiovascular: Negative.  Negative for chest pain, palpitations and leg swelling.  Gastrointestinal: Negative.  Negative for heartburn, nausea and vomiting.  Genitourinary:        AUB: I was able to call GYN and get her scheduled for office visit  Musculoskeletal: Negative.  Negative for myalgias.  Neurological: Negative.  Negative for dizziness, focal weakness, seizures and headaches.  Psychiatric/Behavioral: Negative.  Negative for suicidal ideas.     Past Medical History:  Diagnosis Date   Gout    Hyperlipidemia    Hypertension     History reviewed. No pertinent surgical history.  Family History  Problem Relation Age of Onset   Hypertension Mother    Healthy Father    Breast cancer Maternal Aunt    Hypertension Brother     Social History Reviewed with no changes to be made today.   Outpatient Medications Prior to Visit  Medication Sig Dispense Refill   amLODipine (NORVASC) 10 MG tablet Take 1 tablet (10 mg total) by mouth daily. 90 tablet 1   Cholecalciferol (VITAMIN D3 PO) Take by mouth.     Multiple Vitamin (MULTIVITAMIN) tablet Take 1 tablet by mouth daily.     naproxen (NAPROSYN) 500 MG tablet TAKE 1 TABLET BY MOUTH TWICE DAILY WITH A MEAL FOR GOUT FLARE 30 tablet 0   losartan (COZAAR) 25 MG tablet Take 1 tablet (25 mg total) by mouth daily. (Patient not taking: Reported on 10/24/2022) 90 tablet 0   No facility-administered medications prior to  visit.    No Known Allergies     Objective:    BP (!) 183/108   Pulse 75   Ht _0  (1.702 m)   Wt 246 lb 3.2 oz (111.7 kg)   LMP 09/22/2022 (Approximate)   SpO2 99%   BMI 38.56 kg/m  Wt Readings from Last 3 Encounters:  10/24/22 246 lb 3.2 oz (111.7 kg)  07/21/22 242 lb (109.8 kg)  04/19/22 247 lb (112 kg)    Physical Exam Vitals and nursing note reviewed.  Constitutional:      Appearance: She is well-developed.  HENT:     Head: Normocephalic and atraumatic.  Cardiovascular:      Rate and Rhythm: Normal rate and regular rhythm.     Heart sounds: Normal heart sounds. No murmur heard.    No friction rub. No gallop.  Pulmonary:     Effort: Pulmonary effort is normal. No tachypnea or respiratory distress.     Breath sounds: Normal breath sounds. No decreased breath sounds, wheezing, rhonchi or rales.  Chest:     Chest wall: No tenderness.  Abdominal:     General: Bowel sounds are normal.     Palpations: Abdomen is soft.  Musculoskeletal:        General: Normal range of motion.     Cervical back: Normal range of motion.  Skin:    General: Skin is warm and dry.  Neurological:     Mental Status: She is alert and oriented to person, place, and time.     Coordination: Coordination normal.  Psychiatric:        Behavior: Behavior normal. Behavior is cooperative.        Thought Content: Thought content normal.        Judgment: Judgment normal.          Patient has been counseled extensively about nutrition and exercise as well as the importance of adherence with medications and regular follow-up. The patient was given clear instructions to go to ER or return to medical center if symptoms don't improve, worsen or new problems develop. The patient verbalized understanding.   Follow-up: No follow-ups on file.   [name], FNP-BC [facility] [place], [place]   10/24/2022, 3:50 PM

## 2022-10-25 ENCOUNTER — Other Ambulatory Visit: Payer: Self-pay | Admitting: Nurse Practitioner

## 2022-10-25 DIAGNOSIS — I10 Essential (primary) hypertension: Secondary | ICD-10-CM

## 2022-10-25 DIAGNOSIS — M109 Gout, unspecified: Secondary | ICD-10-CM

## 2022-10-25 LAB — CMP14+EGFR
ALT: 14 IU/L (ref 0–32)
AST: 16 IU/L (ref 0–40)
Albumin/Globulin Ratio: 1.3 (ref 1.2–2.2)
Albumin: 4.4 g/dL (ref 3.8–4.9)
Alkaline Phosphatase: 74 IU/L (ref 44–121)
BUN/Creatinine Ratio: 19 (ref 9–23)
BUN: 18 mg/dL (ref 6–24)
Bilirubin Total: 0.4 mg/dL (ref 0.0–1.2)
CO2: 24 mmol/L (ref 20–29)
Calcium: 9.4 mg/dL (ref 8.7–10.2)
Chloride: 103 mmol/L (ref 96–106)
Creatinine, Ser: 0.93 mg/dL (ref 0.57–1.00)
Globulin, Total: 3.4 g/dL (ref 1.5–4.5)
Glucose: 89 mg/dL (ref 70–99)
Potassium: 3.6 mmol/L (ref 3.5–5.2)
Sodium: 142 mmol/L (ref 134–144)
Total Protein: 7.8 g/dL (ref 6.0–8.5)
eGFR: 73 mL/min/{1.73_m2} (ref >59)

## 2022-10-25 LAB — URIC ACID: Uric Acid: 8.6 mg/dL — ABNORMAL HIGH (ref 3.0–7.2)

## 2022-10-25 MED ORDER — LISINOPRIL 40 MG PO TABS: 40.0000 mg | ORAL_TABLET | Freq: Every day | ORAL | 3 refills | Status: DC

## 2022-10-25 MED ORDER — ALLOPURINOL 100 MG PO TABS: 100.0000 mg | ORAL_TABLET | Freq: Every day | ORAL | 6 refills | Status: AC

## 2022-10-26 ENCOUNTER — Other Ambulatory Visit: Payer: Self-pay | Admitting: Nurse Practitioner

## 2022-10-26 ENCOUNTER — Ambulatory Visit: Payer: Self-pay | Admitting: Hematology and Oncology

## 2022-10-26 ENCOUNTER — Ambulatory Visit: Payer: No Typology Code available for payment source

## 2022-10-26 ENCOUNTER — Ambulatory Visit: Admission: RE | Admit: 2022-10-26 | Payer: No Typology Code available for payment source | Source: Ambulatory Visit

## 2022-10-26 ENCOUNTER — Ambulatory Visit
Admission: RE | Admit: 2022-10-26 | Discharge: 2022-10-26 | Disposition: A | Discharge status: Home / Self Care | Payer: No Typology Code available for payment source | Source: Ambulatory Visit | Attending: Nurse Practitioner | Admitting: Nurse Practitioner

## 2022-10-26 VITALS — BP 185/115 | Wt 243.0 lb

## 2022-10-26 DIAGNOSIS — R928 Other abnormal and inconclusive findings on diagnostic imaging of breast: Secondary | ICD-10-CM

## 2022-10-26 DIAGNOSIS — N6489 Other specified disorders of breast: Secondary | ICD-10-CM

## 2022-10-26 NOTE — Patient Instructions (Signed)
Taught Tammy Barajas about BSE and gave educational materials to take home. Patient did not need a Pap smear today due to last Pap smear was in 2020 per patient and will be due in 2025. Let her know [facility] will cover Pap smears every 5 years unless has a history of abnormal Pap smears. Referred patient to the [facility] for diagnostic mammogram. Appointment scheduled for 10/26/2022. Patient aware of appointment and will be there. Let patient know will follow up with her within the next couple weeks with results. Tammy Barajas verbalized understanding.  Melodye Ped, NP 10:52 AM

## 2022-10-26 NOTE — Progress Notes (Signed)
Ms. Tammy Barajas is a 57 y.o. female who presents to [facility] clinic today with no complaints.    Pap Smear: Pap not smear completed today. Last Pap smear was 2020 and was normal. Per patient has no history of an abnormal Pap smear. Last Pap smear result is available in Epic.   Physical exam: Breasts Breasts symmetrical. No skin abnormalities bilateral breasts. No nipple retraction bilateral breasts. No nipple discharge bilateral breasts. No lymphadenopathy. No lumps palpated bilateral breasts.    MS DIGITAL SCREENING TOMO BILATERAL  Result Date: 09/23/2022 CLINICAL DATA:  Screening. EXAM: DIGITAL SCREENING BILATERAL MAMMOGRAM WITH TOMOSYNTHESIS AND CAD TECHNIQUE: Bilateral screening digital craniocaudal and mediolateral oblique mammograms were obtained. Bilateral screening digital breast tomosynthesis was performed. The images were evaluated with computer-aided detection. COMPARISON:  Previous exam(s). ACR Breast Density Category b: There are scattered areas of fibroglandular density. FINDINGS: In the right breast, a possible asymmetry warrants further evaluation. In the left breast, no findings suspicious for malignancy. IMPRESSION: Further evaluation is suggested for possible asymmetry in the right breast. RECOMMENDATION: Diagnostic mammogram and possibly ultrasound of the right breast. (Code:FI-R-55M) The patient will be contacted regarding the findings, and additional imaging will be scheduled. BI-RADS CATEGORY  0: Incomplete. Need additional imaging evaluation and/or prior mammograms for comparison. Electronically Signed   By: Dorise Bullion III M.D.   On: 09/23/2022 09:31   MS DIGITAL SCREENING TOMO BILATERAL  Result Date: 10/29/2020 CLINICAL DATA:  Screening. EXAM: DIGITAL SCREENING BILATERAL MAMMOGRAM WITH TOMO AND CAD COMPARISON:  Previous exam(s). ACR Breast Density Category b: There are scattered areas of fibroglandular density. FINDINGS: There are no findings suspicious for malignancy.  Images were processed with CAD. IMPRESSION: No mammographic evidence of malignancy. A result letter of this screening mammogram will be mailed directly to the patient. RECOMMENDATION: Screening mammogram in one year. (Code:SM-B-01Y) BI-RADS CATEGORY  1: Negative. Electronically Signed   By: Evangeline Dakin M.D.   On: 10/29/2020 08:00   MS DIGITAL SCREENING TOMO BILATERAL  Result Date: 08/19/2019 CLINICAL DATA:  Screening. EXAM: DIGITAL SCREENING BILATERAL MAMMOGRAM WITH TOMO AND CAD COMPARISON:  Previous exam(s). ACR Breast Density Category b: There are scattered areas of fibroglandular density. FINDINGS: There are no findings suspicious for malignancy. Images were processed with CAD. IMPRESSION: No mammographic evidence of malignancy. A result letter of this screening mammogram will be mailed directly to the patient. RECOMMENDATION: Screening mammogram in one year. (Code:SM-B-01Y) BI-RADS CATEGORY  1: Negative. Electronically Signed   By: Audie Pinto M.D.   On: 08/19/2019 13:39       Pelvic/Bimanual Pap is not indicated today    Smoking History: Patient has never smoked and was not referred to quit line.    Patient Navigation: Patient education provided. Access to services provided for patient through [facility] program. No interpreter provided. No transportation provided   Colorectal Cancer Screening: Per patient has never had colonoscopy completed No complaints today. FIT test pending.   Breast and Cervical Cancer Risk Assessment: Patient does not have family history of breast cancer, known genetic mutations, or radiation treatment to the chest before age 44. Patient does not have history of cervical dysplasia, immunocompromised, or DES exposure in-utero.  Risk Scores as of 10/26/2022     Tammy Barajas           5-year 1.33 %   Lifetime 8.02 %            Last calculated by Claretha Cooper, CMA on 10/26/2022 at 10:34 AM  A: [facility] exam without pap smear No complaints with benign  exam. Follow up possible asymmetry right breast.   P: Referred patient to the [facility] for a diagnostic mammogram. Appointment scheduled 10/26/2022.  Melodye Ped, NP 10/26/2022 10:49 AM

## 2022-10-27 ENCOUNTER — Other Ambulatory Visit: Payer: Self-pay | Admitting: Nurse Practitioner

## 2022-10-27 ENCOUNTER — Encounter: Payer: Self-pay | Admitting: Nurse Practitioner

## 2022-10-27 MED ORDER — VALSARTAN 40 MG PO TABS: 40.0000 mg | ORAL_TABLET | Freq: Every day | ORAL | 3 refills | Status: DC

## 2022-10-28 ENCOUNTER — Other Ambulatory Visit: Payer: Self-pay | Admitting: Nurse Practitioner
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
## 2022-10-30 NOTE — Telephone Encounter (Signed)
Requested Prescriptions  Pending Prescriptions Disp Refills   naproxen (NAPROSYN) 500 MG tablet [Pharmacy Med Name: Naproxen 500 MG Oral Tablet] 30 tablet 0    Sig: TAKE 1 TABLET BY MOUTH TWICE DAILY WITH A MEAL FOR GOUT FLARE     Analgesics:  NSAIDS Failed - 10/28/2022  4:29 PM      Failed - Manual Review: Labs are only required if the patient has taken medication for more than 8 weeks.      Passed - Cr in normal range and within 360 days    Creat  Date Value Ref Range Status  05/10/2015 1.07 0.50 - 1.10 mg/dL Final   Creatinine, Ser  Date Value Ref Range Status  10/24/2022 0.93 0.57 - 1.00 mg/dL Final   Creatinine,U  Date Value Ref Range Status  05/18/2009  mg/dL Final   46.3 (NOTE)  Cutoff Values for Urine Drug Screen:        Drug Class           Cutoff (ng/mL)        Amphetamines            1000        Barbiturates             200        Cocaine Metabolites      300        Benzodiazepines          200        Methadone                 300        Opiates                 2000        Phencyclidine             25        Propoxyphene             300        Marijuana Metabolites     50  For medical purposes only.         Passed - HGB in normal range and within 360 days    Hemoglobin  Date Value Ref Range Status  07/21/2022 13.4 11.1 - 15.9 g/dL Final         Passed - PLT in normal range and within 360 days    Platelets  Date Value Ref Range Status  07/21/2022 362 150 - 450 x10E3/uL Final         Passed - HCT in normal range and within 360 days    Hematocrit  Date Value Ref Range Status  07/21/2022 40.6 34.0 - 46.6 % Final         Passed - eGFR is 30 or above and within 360 days    GFR, Est African American  Date Value Ref Range Status  07/03/2014 82 mL/min Final   GFR calc Af Amer  Date Value Ref Range Status  08/24/2020 76 >59 mL/min/1.73 Final    Comment:    **In accordance with recommendations from the NKF-ASN Task force,**   [facility] is in the process of updating  its eGFR calculation to the   2021 CKD-EPI creatinine equation that estimates kidney function   without a race variable.    GFR, Est Non African American  Date Value Ref Range Status  07/03/2014 71 mL/min Final    Comment:      The  estimated GFR is a calculation valid for adults (>=35 years old) that uses the CKD-EPI algorithm to adjust for age and sex. It is   not to be used for children, pregnant women, hospitalized patients,    patients on dialysis, or with rapidly changing kidney function. According to the NKDEP, eGFR >89 is normal, 60-89 shows mild impairment, 30-59 shows moderate impairment, 15-29 shows severe impairment and <15 is ESRD.     GFR calc non Af Amer  Date Value Ref Range Status  08/24/2020 66 >59 mL/min/1.73 Final   eGFR  Date Value Ref Range Status  10/24/2022 73 >59 mL/min/1.73 Final         Passed - Patient is not pregnant      Passed - Valid encounter within last 12 months    Recent Outpatient Visits           6 days ago Primary hypertension   [place], Vernia Buff, NP   2 months ago Essential hypertension   [place], Jarome [place]atin, RPH-CPP   3 months ago Essential hypertension   [place], Zelda W, NP   6 months ago Essential hypertension   [place], Vernia Buff, NP   9 months ago Abnormal uterine bleeding   [facility] at [facility], Loraine Grip, PA-C       Future Appointments             In 1 month [name], NP [place]

## 2022-10-31 ENCOUNTER — Telehealth: Payer: Self-pay

## 2022-10-31 NOTE — Telephone Encounter (Signed)
PT call for results - shared provider's note.  [name], NP 10/31/2022 10:29 AM EST     Normal mammogram.  Repeat in 1 year.

## 2022-11-27 ENCOUNTER — Encounter: Payer: Self-pay | Admitting: Obstetrics and Gynecology

## 2022-11-27 ENCOUNTER — Encounter: Payer: Self-pay | Admitting: Family Medicine

## 2022-11-27 ENCOUNTER — Other Ambulatory Visit (HOSPITAL_COMMUNITY)
Admission: RE | Admit: 2022-11-27 | Discharge: 2022-11-27 | Disposition: A | Discharge status: Home / Self Care | Payer: Medicaid Other | Source: Ambulatory Visit | Attending: Obstetrics and Gynecology | Admitting: Obstetrics and Gynecology

## 2022-11-27 ENCOUNTER — Ambulatory Visit (INDEPENDENT_AMBULATORY_CARE_PROVIDER_SITE_OTHER): Payer: Medicaid Other | Admitting: Obstetrics and Gynecology

## 2022-11-27 VITALS — BP 161/101 | HR 94 | Ht 67.0 in | Wt 244.8 lb

## 2022-11-27 DIAGNOSIS — Z1231 Encounter for screening mammogram for malignant neoplasm of breast: Secondary | ICD-10-CM | POA: Diagnosis not present

## 2022-11-27 DIAGNOSIS — N939 Abnormal uterine and vaginal bleeding, unspecified: Secondary | ICD-10-CM

## 2022-11-27 DIAGNOSIS — N882 Stricture and stenosis of cervix uteri: Secondary | ICD-10-CM | POA: Diagnosis not present

## 2022-11-27 MED ORDER — MEGESTROL ACETATE 40 MG PO TABS: 40.0000 mg | ORAL_TABLET | Freq: Two times a day (BID) | ORAL | 5 refills | Status: DC

## 2022-11-27 NOTE — Procedures (Signed)
Endometrial Biopsy Attempt Procedure Note  Pre-operative Diagnosis: AUB. Perimenopause  Post-operative Diagnosis: same. Cervical stenosis  Procedure Details  The risks (including infection, bleeding, pain, and uterine perforation) and benefits of the procedure were explained to the patient and Written informed consent was obtained.  The patient was placed in the dorsal lithotomy position.  Bimanual exam showed the uterus to be in the neutral position.  A Graves' speculum inserted in the vagina, and the cervix was visualized and a pap smear performed after removing old blood from the vault. The cervix was then prepped with povidone iodine, and a sharp tenaculum was applied to the anterior lip of the cervix for stabilization.  A pipelle was inserted into cervical canal but could only go to a depth of 3.5cm. I used yellow os finders but I still could not get past this depth; the patient tolerated the procedure very well.  One aspiration from the pipelle done for a scanty amount. to a depth of 3.5cm.  A {Desc; minimal/small/moderate/large/very large:110034} amount of tissue was collected after {NUMBERS 1-4:211020791} passes. The sample was sent for pathologic examination.  Condition: Stable  Complications: None  Plan: The patient was advised to call for any fever or for prolonged or severe pain or bleeding. She was advised to use {meds; pain:16413} as needed for mild to moderate pain. She was advised to avoid vaginal intercourse for 48 hours or until the bleeding has completely stopped.  Durene Romans MD Attending Center for [employer])

## 2022-11-27 NOTE — Progress Notes (Unsigned)
Obstetrics and Gynecology New Patient Evaluation  Appointment Date: 11/27/2022  OBGYN Clinic: [facility]  Primary Care Provider: [name]  Referring Provider: [name], NP  Chief Complaint:  Chief Complaint  Patient presents with   AUB     History of Present Illness: Tammy Barajas is a 57 y.o. African-American G3P0030, seen for the above chief complaint. Her past medical history is significant for HTN, BMI 38  Patient seen by PCP and referred to [place] last year but missed her appointment. Ultrasound from last year (Jan 2023) showed ES of 32m with small amount of simple fluid in the canal and no focal abnormalities noted. Uterus 12cm  with 4-6cm fibroids.   It's hard to get a solid story but it sounds like she's had AUB since last year or maybe the year before where she has a period and can have AUB in between. Patient believes she is currently on period. Climateric s/s for the past few years and she had some times where she missed a period but she's had the AUB for the past year, maybe two.   No chest pain, SOB, visual s/s.   Review of Systems: Pertinent items noted in HPI and remainder of comprehensive ROS otherwise negative.    Patient Active Problem List   Diagnosis Date Noted   Abnormal uterine bleeding 01/31/2022   Hyperlipidemia 01/31/2022   Alopecia 01/31/2022   Class 2 obesity due to excess calories without serious comorbidity with body mass index (BMI) of 38.0 to 38.9 in adult 01/31/2022   Essential hypertension 07/20/2016    Past Medical History:  Past Medical History:  Diagnosis Date   Gout    Hyperlipidemia    Hypertension     Past Surgical History:  No past surgical history on file.  Past Obstetrical History:  OB History  Gravida Para Term Preterm AB Living  3       3 0  SAB IAB Ectopic Multiple Live Births  3       0    # Outcome Date GA Lbr Len/2nd Weight Sex Delivery Anes PTL Lv  3 SAB           2  SAB           1 SAB             Past Gynecological History: As per HPI. History of Pap Smear(s): Yes.   Last pap 2020, which was negative cytology and HPV She is currently using no method for contraception.   Social History:  Social History   Socioeconomic History   Marital status: Single    Spouse name: Not on file   Number of children: 0   Years of education: Not on file   Highest education level: Not on file  Occupational History   Not on file  Tobacco Use   Smoking status: Never   Smokeless tobacco: Never  Vaping Use   Vaping Use: Never used  Substance and Sexual Activity   Alcohol use: Yes    Comment: occassionally   Drug use: Yes    Frequency: 3.0 times per week    Types: Marijuana    Comment: last use 05/09/15   Sexual activity: Yes    Birth control/protection: None, Condom  Other Topics Concern   Not on file  Social History Narrative   Not on file   Social Determinants of Health   Financial Resource Strain: Not on file  Food Insecurity: No  Food Insecurity (10/26/2022)   Hunger Vital Sign    Worried About Running Out of Food in the Last Year: Never true    Ran Out of Food in the Last Year: Never true  Transportation Needs: No Transportation Needs (10/26/2022)   PRAPARE - Hydrologist (Medical): No    Lack of Transportation (Non-Medical): No  Physical Activity: Not on file  Stress: Not on file  Social Connections: Not on file  Intimate Partner Violence: Not on file    Family History:  Family History  Problem Relation Age of Onset   Hypertension Mother    Healthy Father    Breast cancer Maternal Aunt    Hypertension Brother    Medications Katianna A. Lawniczak had no medications administered during this visit. Current Outpatient Medications  Medication Sig Dispense Refill   allopurinol (ZYLOPRIM) 100 MG tablet Take 1 tablet (100 mg total) by mouth daily. 30 tablet 6   amLODipine (NORVASC) 10 MG tablet Take 1 tablet (10 mg  total) by mouth daily. 90 tablet 1   Cholecalciferol (VITAMIN D3 PO) Take by mouth.     Multiple Vitamin (MULTIVITAMIN) tablet Take 1 tablet by mouth daily.     naproxen (NAPROSYN) 500 MG tablet TAKE 1 TABLET BY MOUTH TWICE DAILY WITH A MEAL FOR GOUT FLARE 30 tablet 2   valsartan (DIOVAN) 40 MG tablet Take 1 tablet (40 mg total) by mouth daily. (Patient not taking: Reported on 11/27/2022) 90 tablet 3   No current facility-administered medications for this visit.    Allergies Hydrochlorothiazide and Lisinopril   Physical Exam:  BP (!) 161/101   Pulse 94   Ht '5\' 7"'$  (1.702 m)   Wt 244 lb 12.8 oz (111 kg)   BMI 38.34 kg/m  Body mass index is 38.34 kg/m. General appearance: Well nourished, well developed female in no acute distress.  Cardiovascular: normal s1 and s2.  No murmurs, rubs or gallops. Respiratory:  Clear to auscultation bilateral. Normal respiratory effort Abdomen: positive bowel sounds and no masses, hernias; diffusely non tender to palpation, non distended Neuro/Psych:  Normal mood and affect.  Skin:  Warm and dry.  Lymphatic:  No inguinal lymphadenopathy.   Pelvic exam: is limited by body habitus EGBUS: within normal limits Vagina: approximately 67m of old blood in the vault Cervix: normal appearing, no active bleeding, nttp. Cervical stenosis (see embx attempt procedure note) Uterus:  nonenlarged and non tender, mobile Adnexa:  normal adnexa and no mass, fullness, tenderness Rectovaginal: deferred  Laboratory: none  Radiology:  Narrative & Impression  CLINICAL DATA:  Initial evaluation for amenorrhea.   EXAM: TRANSABDOMINAL AND TRANSVAGINAL ULTRASOUND OF PELVIS   TECHNIQUE: Both transabdominal and transvaginal ultrasound examinations of the pelvis were performed. Transabdominal technique was performed for global imaging of the pelvis including uterus, ovaries, adnexal regions, and pelvic cul-de-sac. It was necessary to proceed with endovaginal exam  following the transabdominal exam to visualize the endometrium and ovaries.   COMPARISON:  Prior ultrasound from 12/20/2006.   FINDINGS: Uterus   Measurements: 12.4 x 7.4 x 8.2 cm = volume: 467.4 mL. Uterus is anteverted. 6.2 x 5.1 x 4.9 cm intramural fibroid present at the right posterior uterine body. 4.1 x 4.0 x 3.5 cm intramural fibroid present at the right anterior uterine fundus. 6.1 x 5.4 x 6.1 cm intramural fibroid present at the left posterior uterine body.   Endometrium   Thickness: 9 mm. No focal abnormality visualized. Small amount of simple fluid noted  within the endocervical canal.   Right ovary   Measurements: 5.2 x 3.9 x 3.7 cm = volume: 39.3 mL. Normal appearance/no adnexal mass. 2.5 cm dominant follicle noted.   Left ovary   Not visualized.  No adnexal mass.   Other findings   No abnormal free fluid.   IMPRESSION: 1. Three distinct uterine fibroids measuring up to 6.2 cm as above. 2. Normal endometrium. 3. Normal right ovary, with nonvisualization of the left ovary. No adnexal mass or free fluid.     Electronically Signed   By: Jeannine Boga M.D.   On: 10/31/2021 18:52   Assessment: patient stable  Plan:  1. Abnormal uterine bleeding (AUB) Ultrasound images reviewed.  Likely peri-menopausal AUB but will get FSH, estradiol level, in addition to other lab work. Recommend bid megace given amount of bleeding Unfortunately, I couldn't get past the internal os (sample sent but likely endocervical only) even with os finders so I told her she will need a hysteroscopy, d&c in the OR for endometrial sampling but she needs to have her BPs under better control; see below - Cervicovaginal ancillary only( [place]) - TSH Rfx on Abnormal to Free T4 - Follicle stimulating hormone - Estradiol - CBC - Beta hCG quant (ref lab) - Cytology - PAP( ) - Surgical pathology( [facility])  2. Encounter for screening mammogram for  malignant neoplasm of breast Repeat mammogram recommended  3. Cervical Stenosis.   4. HTN See above. Patient only took norvasc today. Will CC PCP on today's note. ED precautions given.   RTC f/u after lab results.   Durene Romans MD Attending Center for [employer] [facility])

## 2022-11-28 ENCOUNTER — Encounter: Payer: Self-pay | Admitting: Obstetrics and Gynecology

## 2022-11-28 HISTORY — PX: ENDOMETRIAL BIOPSY: PRO73

## 2022-11-28 LAB — CBC
Hematocrit: 37.6 % (ref 34.0–46.6)
Hemoglobin: 12.6 g/dL (ref 11.1–15.9)
MCH: 27.6 pg (ref 26.6–33.0)
MCHC: 33.5 g/dL (ref 31.5–35.7)
MCV: 83 fL (ref 79–97)
Platelets: 385 10*3/uL (ref 150–450)
RBC: 4.56 x10E6/uL (ref 3.77–5.28)
RDW: 16.1 % — ABNORMAL HIGH (ref 11.7–15.4)
WBC: 5.2 10*3/uL (ref 3.4–10.8)

## 2022-11-28 LAB — TSH RFX ON ABNORMAL TO FREE T4: TSH: 1.13 u[IU]/mL (ref 0.450–4.500)

## 2022-11-28 LAB — SURGICAL PATHOLOGY

## 2022-11-28 LAB — ESTRADIOL: Estradiol: 27.7 pg/mL

## 2022-11-28 LAB — FOLLICLE STIMULATING HORMONE: FSH: 10.6 m[IU]/mL

## 2022-11-28 LAB — BETA HCG QUANT (REF LAB): hCG Quant: <1 m[IU]/mL

## 2022-12-01 ENCOUNTER — Telehealth: Payer: Self-pay

## 2022-12-01 NOTE — Telephone Encounter (Signed)
Pt called office asking about results from appointment with Dr. Ilda Basset 02/05. I called pt. She did not answer, left VM for her to call office back.

## 2022-12-05 LAB — CYTOLOGY - PAP
Comment: NEGATIVE
High risk HPV: NEGATIVE

## 2022-12-06 ENCOUNTER — Telehealth: Payer: Self-pay | Admitting: *Deleted

## 2022-12-06 NOTE — Telephone Encounter (Addendum)
Duplicate encounter opened in error.

## 2022-12-06 NOTE — Telephone Encounter (Signed)
Pt left message @ 1405 stating that she is calling for her test results.

## 2022-12-07 NOTE — Telephone Encounter (Signed)
Called patient and reviewed results with her. Patient states she is having some hot flashes and irregular periods. Discussed with patient that can be normal when going through perimenopause. Patient verbalized understanding & states she is going today to pick up megace prescription. Patient had no other questions.

## 2022-12-12 ENCOUNTER — Ambulatory Visit: Payer: Self-pay | Admitting: Nurse Practitioner

## 2022-12-13 ENCOUNTER — Ambulatory Visit: Payer: Medicaid Other | Attending: Nurse Practitioner | Admitting: Nurse Practitioner

## 2022-12-13 ENCOUNTER — Encounter: Payer: Self-pay | Admitting: Nurse Practitioner

## 2022-12-13 VITALS — BP 138/93 | HR 96 | Ht 67.0 in | Wt 240.8 lb

## 2022-12-13 DIAGNOSIS — M25561 Pain in right knee: Secondary | ICD-10-CM | POA: Diagnosis not present

## 2022-12-13 DIAGNOSIS — I10 Essential (primary) hypertension: Secondary | ICD-10-CM

## 2022-12-13 DIAGNOSIS — G8929 Other chronic pain: Secondary | ICD-10-CM | POA: Diagnosis not present

## 2022-12-13 DIAGNOSIS — L659 Nonscarring hair loss, unspecified: Secondary | ICD-10-CM | POA: Diagnosis not present

## 2022-12-13 DIAGNOSIS — Z0120 Encounter for dental examination and cleaning without abnormal findings: Secondary | ICD-10-CM

## 2022-12-13 MED ORDER — AMLODIPINE BESYLATE 10 MG PO TABS: 10.0000 mg | ORAL_TABLET | Freq: Every day | ORAL | 1 refills | Status: DC
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
## 2022-12-14 LAB — CMP14+EGFR
ALT: 15 IU/L (ref 0–32)
AST: 14 IU/L (ref 0–40)
Albumin/Globulin Ratio: 1.1 — ABNORMAL LOW (ref 1.2–2.2)
Albumin: 4 g/dL (ref 3.8–4.9)
Alkaline Phosphatase: 68 IU/L (ref 44–121)
BUN/Creatinine Ratio: 17 (ref 9–23)
BUN: 16 mg/dL (ref 6–24)
Bilirubin Total: <0.2 mg/dL (ref 0.0–1.2)
CO2: 19 mmol/L — ABNORMAL LOW (ref 20–29)
Calcium: 9.3 mg/dL (ref 8.7–10.2)
Chloride: 104 mmol/L (ref 96–106)
Creatinine, Ser: 0.95 mg/dL (ref 0.57–1.00)
Globulin, Total: 3.5 g/dL (ref 1.5–4.5)
Glucose: 86 mg/dL (ref 70–99)
Potassium: 4.1 mmol/L (ref 3.5–5.2)
Sodium: 139 mmol/L (ref 134–144)
Total Protein: 7.5 g/dL (ref 6.0–8.5)
eGFR: 71 mL/min/{1.73_m2} (ref >59)

## 2022-12-18 ENCOUNTER — Encounter: Payer: Self-pay | Admitting: Obstetrics and Gynecology

## 2022-12-18 ENCOUNTER — Telehealth: Payer: Self-pay | Admitting: *Deleted

## 2022-12-18 DIAGNOSIS — R87619 Unspecified abnormal cytological findings in specimens from cervix uteri: Secondary | ICD-10-CM | POA: Insufficient documentation

## 2022-12-18 NOTE — Telephone Encounter (Signed)
I called Tammy Barajas and left a message I am calling with non-urgent results- please call office tomorrow. Once patient is notified will send message to registar to schedule [name]

## 2022-12-18 NOTE — Telephone Encounter (Signed)
-----   Message from Aletha Halim, MD sent at 12/18/2022  2:02 PM EST ----- Can you let her know that her pap smear came back slightly abnormal so I need her to come back and see me for a colposcopy? Anytime in the next 4 weeks is fine.  Thanks Dr. Ilda Basset

## 2022-12-21 NOTE — Telephone Encounter (Signed)
Spoke to patient to inform on her colposcopy appointment on 01/24/2023.   Patient is aware of time and date.  Francisco Capuchin CMA.

## 2022-12-28 ENCOUNTER — Ambulatory Visit (INDEPENDENT_AMBULATORY_CARE_PROVIDER_SITE_OTHER): Payer: Medicaid Other

## 2022-12-28 ENCOUNTER — Ambulatory Visit: Payer: Medicaid Other | Admitting: Surgical

## 2022-12-28 DIAGNOSIS — M25561 Pain in right knee: Secondary | ICD-10-CM

## 2022-12-28 DIAGNOSIS — M1711 Unilateral primary osteoarthritis, right knee: Secondary | ICD-10-CM | POA: Diagnosis not present

## 2022-12-28 NOTE — Progress Notes (Signed)
Office Visit Note   Patient: Tammy Barajas           Date of Birth: [birth year]           MRN: [id number] Visit Date: 12/28/2022 Requested by: [name], NP [address],  [place] [address] PCP: [name], NP  Subjective: Chief Complaint  Patient presents with   Right Knee - Pain    HPI: Tammy Barajas is a 57 y.o. female who presents to the office reporting right knee pain.  Patient has history of right knee osteoarthritis.  She was last seen in 2022 with aspiration/injection that provided good relief.  She states that she has had increased knee swelling since the last week.  She feels like "my knee is full fluid".  She wears a knee brace.  Does have history of gout that primarily bothers her in her foot but this is not feel like gout for her.  She uses alcohol to rub on her knee that helps somewhat.  She works at [employer] system which is a new job that she is worked at for about 3 weeks now.  She describes swelling and some weakness of the right knee with the knee feel like it wants to give way.  Interview time, she does enjoy dancing.  No new injury.  No groin pain.  No radicular pain..                ROS: All systems reviewed are negative as they relate to the chief complaint within the history of present illness.  Patient denies fevers or chills.  Assessment & Plan: Visit Diagnoses:  1. Right knee pain, unspecified chronicity     Plan: Patient is a 57 year old female who presents for evaluation of right knee pain.  She has history of right knee osteoarthritis.  She has large effusion on exam today and would like to try aspiration/injection which has historically provided good relief for her in the past.  Right knee was aspirated of 50 cc of inflammatory, nonpurulent synovial fluid.  Cortisone injection administered.  Patient tolerated procedure well.  Will plan for her to follow-up with the office as needed.   Follow-Up  Instructions: No follow-ups on file.   Orders:  Orders Placed This Encounter  Procedures   XR KNEE 3 VIEW RIGHT   No orders of the defined types were placed in this encounter.     Procedures: Large Joint Inj: R knee on 12/28/2022 12:13 PM Indications: diagnostic evaluation, joint swelling and pain Details: 18 G 1.5 in needle, superolateral approach  Arthrogram: No  Medications: 5 mL lidocaine 1 %; 40 mg methylPREDNISolone acetate 40 MG/ML; 4 mL bupivacaine 0.25 % Aspirate: 50 mL Outcome: tolerated well, no immediate complications Procedure, treatment alternatives, risks and benefits explained, specific risks discussed. Consent was given by the patient. Immediately prior to procedure a time out was called to verify the correct patient, procedure, equipment, support staff and site/side marked as required. Patient was prepped and draped in the usual sterile fashion.       Clinical Data: No additional findings.  Objective: Vital Signs: There were no vitals taken for this visit.  Physical Exam:  Constitutional: Patient appears well-developed HEENT:  Head: Normocephalic Eyes:EOM are normal Neck: Normal range of motion Cardiovascular: Normal rate Pulmonary/chest: Effort normal Neurologic: Patient is alert Skin: Skin is warm Psychiatric: Patient has normal mood and affect  Ortho Exam: Ortho exam demonstrates right knee with  2 degrees extension and 115 degrees knee flexion.  She has valgus alignment.  Moderate effusion noted.  No calf tenderness.  Negative Homans' sign.  Tenderness over the medial and lateral joint lines moderately.  No cellulitis or skin changes noted.  Able to perform straight leg raise without extensor lag.  Negative FADIR sign.  Negative Stinchfield sign.  Specialty Comments:  No specialty comments available.  Imaging: No results found.   PMFS History: Patient Active Problem List   Diagnosis Date Noted   Atypical glandular cells of undetermined  significance (AGUS) on cervical Pap smear 12/18/2022   Cervical stenosis (uterine cervix) 11/27/2022   Abnormal uterine bleeding 01/31/2022   Hyperlipidemia 01/31/2022   Alopecia 01/31/2022   Class 2 obesity due to excess calories without serious comorbidity with body mass index (BMI) of 38.0 to 38.9 in adult 01/31/2022   Essential hypertension 07/20/2016   Past Medical History:  Diagnosis Date   Gout    Hyperlipidemia    Hypertension     Family History  Problem Relation Age of Onset   Hypertension Mother    Healthy Father    Breast cancer Maternal Aunt    Hypertension Brother     Past Surgical History:  Procedure Laterality Date   ENDOMETRIAL BIOPSY  11/28/2022   Social History   Occupational History   Not on file  Tobacco Use   Smoking status: Never   Smokeless tobacco: Never  Vaping Use   Vaping Use: Never used  Substance and Sexual Activity   Alcohol use: Yes    Comment: occassionally   Drug use: Yes    Frequency: 3.0 times per week    Types: Marijuana    Comment: last use 05/09/15   Sexual activity: Yes    Birth control/protection: None, Condom

## 2022-12-29 ENCOUNTER — Encounter: Payer: Self-pay | Admitting: Orthopedic Surgery

## 2022-12-29 MED ORDER — LIDOCAINE HCL 1 % IJ SOLN
5.0000 mL | INTRAMUSCULAR | Status: AC | PRN
  Administered 2022-12-28: 5 mL

## 2022-12-29 MED ORDER — METHYLPREDNISOLONE ACETATE 40 MG/ML IJ SUSP
40.0000 mg | INTRAMUSCULAR | Status: AC | PRN
  Administered 2022-12-28: 40 mg via INTRA_ARTICULAR

## 2022-12-29 MED ORDER — BUPIVACAINE HCL 0.25 % IJ SOLN
4.0000 mL | INTRAMUSCULAR | Status: AC | PRN
  Administered 2022-12-28: 4 mL via INTRA_ARTICULAR

## 2023-01-24 ENCOUNTER — Other Ambulatory Visit (HOSPITAL_COMMUNITY)
Admission: RE | Admit: 2023-01-24 | Discharge: 2023-01-24 | Disposition: A | Discharge status: Home / Self Care | Payer: Medicaid Other | Source: Ambulatory Visit | Attending: Family Medicine | Admitting: Family Medicine

## 2023-01-24 ENCOUNTER — Ambulatory Visit (INDEPENDENT_AMBULATORY_CARE_PROVIDER_SITE_OTHER): Payer: Medicaid Other | Admitting: Family Medicine

## 2023-01-24 ENCOUNTER — Encounter: Payer: Self-pay | Admitting: Family Medicine

## 2023-01-24 VITALS — BP 156/93 | HR 82 | Ht 67.0 in | Wt 243.1 lb

## 2023-01-24 DIAGNOSIS — R87619 Unspecified abnormal cytological findings in specimens from cervix uteri: Secondary | ICD-10-CM | POA: Insufficient documentation

## 2023-01-24 NOTE — Progress Notes (Signed)
    GYNECOLOGY CLINIC COLPOSCOPY PROCEDURE NOTE  57 y.o. G3P0030 here for colposcopy for pap finding of:  Lab Results  Component Value Date   DIAGPAP - Atypical glandular cells, NOS (A) 11/27/2022   HPVHIGH Negative 11/27/2022    Pregnancy test: Lab Results  Component Value Date   PREGTESTUR Negative 10/21/2021    Allergies  Allergen Reactions   Hydrochlorothiazide Other (See Comments)    GOUT   Lisinopril Cough    Patient given informed consent, signed copy in the chart, time out was performed.    Placed in lithotomy position. Cervix viewed with speculum and colposcope after application of acetic acid.   Colposcopy Adequacy Cervix fully visualized: Yes. What appeared to be two cervical polyps extruding from os.   SCJ fully visualized: No    Colposcopy Findings Superior polyp appearing structure did have some acetowhite changes and possible mosaicism. Inferior was much more dark red with no color change. Both were biopsied.   Corresponding biopsies were obtained.    ECC specimen was obtained.  All specimens were labeled and sent to pathology.  Hemostatic measures: Monsel's solution  Complications: none  Patient tolerated the procedure well.  OBGyn Exam  Colposcopy Impressions Low grade features  Plan Treatment plan pending biopsy results, per patient preference they will be communicated by telephone.  Patient was given post procedure instructions.  Will follow up pathology and manage accordingly; patient will be contacted with results and recommendations.  Routine preventative health maintenance measures emphasized.  Clarnce Flock, MD/MPH Attending Family Medicine Physician, [facility] for [facility], [place]

## 2023-01-26 LAB — SURGICAL PATHOLOGY

## 2023-01-26 NOTE — Progress Notes (Signed)
Normal colpo, repeat cotesting in 1 year Attempted to contact by telephone, did not answer and voicemail is full Notified by [employer]

## 2023-02-07 ENCOUNTER — Telehealth: Payer: Self-pay

## 2023-02-07 NOTE — Telephone Encounter (Signed)
Calling for results.

## 2023-02-08 NOTE — Telephone Encounter (Signed)
Notified pt results recommended by Dr. Crissie Reese- Normal colpo, repeat cotesting in 1 year.  Pt stated that she stopped taking the Megace as it was not effective.  Pt reports that she was taking it one tablet twice a day and her bleeding has not stopped.  Per chart review pt can take it two tablets twice a day with heavy bleeding.  I informed pt to take as prescribe requested and that she has refills at her pharmacy.  Pt verbalized understanding.   Leonette Nutting  02/08/23

## 2023-03-09 ENCOUNTER — Telehealth: Payer: Self-pay

## 2023-03-09 NOTE — Telephone Encounter (Signed)
Patient attempted to be outreached by Francetta Found, PharmD Candidate on 03/09/2023 to discuss hypertension. Patient hung up once asked about medications.  Francetta Found, PharmD Candidate

## 2023-03-13 ENCOUNTER — Encounter: Payer: Self-pay | Admitting: Nurse Practitioner

## 2023-03-13 ENCOUNTER — Ambulatory Visit: Payer: Medicaid Other | Attending: Nurse Practitioner | Admitting: Nurse Practitioner

## 2023-03-13 VITALS — BP 142/95 | HR 96 | Ht 67.0 in | Wt 244.0 lb

## 2023-03-13 DIAGNOSIS — I10 Essential (primary) hypertension: Secondary | ICD-10-CM

## 2023-03-13 DIAGNOSIS — Z8739 Personal history of other diseases of the musculoskeletal system and connective tissue: Secondary | ICD-10-CM

## 2023-03-13 MED ORDER — CLONIDINE HCL 0.2 MG PO TABS
0.2000 mg | ORAL_TABLET | Freq: Once | ORAL | Status: AC
Start: 2023-03-13 — End: 2023-03-13
  Administered 2023-03-13: 0.2 mg via ORAL

## 2023-03-13 NOTE — Progress Notes (Signed)
Assessment & Plan:  Tammy Barajas was seen today for hypertension.  Diagnoses and all orders for this visit:  Essential hypertension -     cloNIDine (CATAPRES) tablet 0.2 mg -     CMP14+EGFR Continue all antihypertensives as prescribed.  Reminded to bring in blood pressure log for follow  up appointment.  RECOMMENDATIONS: DASH/Mediterranean Diets are healthier choices for HTN.    History of gout -     Uric Acid    Patient has been counseled on age-appropriate routine health concerns for screening and prevention. These are reviewed and up-to-date. Referrals have been placed accordingly. Immunizations are up-to-date or declined.    Subjective:   Chief Complaint  Patient presents with   Hypertension   HPI Tammy Barajas 57 y.o. female presents to office today for follow up to HTN.  She has a past medical history of medication non adherence , AUB with anemia Hyperlipidemia and Hypertension.     HTN  Blood pressure was significantly elevated on arrival. She was given clonidine 0.2 mg PO and there was a substantial decrease in her BP. She endorses non adherence and dietary modifications have not been implemented.  BP Readings from Last 3 Encounters:  03/13/23 (!) 142/95  01/24/23 (!) 156/93  12/13/22 (!) 138/93    GYN Is interested in hormonal contraception to help lessen or stop the menstrual flow. Would like to know what she would be a candidate for based on her health history. Does not want depo.    Review of Systems  Constitutional:  Negative for fever, malaise/fatigue and weight loss.  HENT: Negative.  Negative for nosebleeds.   Eyes: Negative.  Negative for blurred vision, double vision and photophobia.  Respiratory: Negative.  Negative for cough and shortness of breath.   Cardiovascular: Negative.  Negative for chest pain, palpitations and leg swelling.  Gastrointestinal: Negative.  Negative for heartburn, nausea and vomiting.  Genitourinary:        AUB   Musculoskeletal: Negative.  Negative for myalgias.  Neurological: Negative.  Negative for dizziness, focal weakness, seizures and headaches.  Psychiatric/Behavioral: Negative.  Negative for suicidal ideas.     Past Medical History:  Diagnosis Date   Gout    Hyperlipidemia    Hypertension     Past Surgical History:  Procedure Laterality Date   ENDOMETRIAL BIOPSY  11/28/2022    Family History  Problem Relation Age of Onset   Hypertension Mother    Healthy Father    Breast cancer Maternal Aunt    Hypertension Brother     Social History Reviewed with no changes to be made today.   Outpatient Medications Prior to Visit  Medication Sig Dispense Refill   amLODipine (NORVASC) 10 MG tablet Take 1 tablet (10 mg total) by mouth daily. 90 tablet 1   Cholecalciferol (VITAMIN D3 PO) Take by mouth.     megestrol (MEGACE) 40 MG tablet Take 1 tablet (40 mg total) by mouth 2 (two) times daily. Can increase to two tablets twice a day in the event of heavy bleeding 60 tablet 5   Multiple Vitamin (MULTIVITAMIN) tablet Take 1 tablet by mouth daily.     hydrochlorothiazide (HYDRODIURIL) 25 MG tablet Take 25 mg by mouth daily.     allopurinol (ZYLOPRIM) 100 MG tablet Take 1 tablet (100 mg total) by mouth daily. (Patient not taking: Reported on 03/13/2023) 30 tablet 6   naproxen (NAPROSYN) 500 MG tablet TAKE 1 TABLET BY MOUTH TWICE DAILY WITH A MEAL FOR GOUT FLARE (  Patient not taking: Reported on 01/24/2023) 30 tablet 2   No facility-administered medications prior to visit.    Allergies  Allergen Reactions   Hydrochlorothiazide Other (See Comments)    GOUT   Lisinopril Cough       Objective:    BP (!) 142/95   Pulse 96   Ht 5\' 7"  (1.702 m)   Wt 244 lb (110.7 kg)   LMP 03/13/2023 (Approximate)   SpO2 100%   BMI 38.22 kg/m  Wt Readings from Last 3 Encounters:  03/13/23 244 lb (110.7 kg)  01/24/23 243 lb 1.6 oz (110.3 kg)  12/13/22 240 lb 12.8 oz (109.2 kg)    Physical Exam Vitals  and nursing note reviewed.  Constitutional:      Appearance: She is well-developed.  HENT:     Head: Normocephalic and atraumatic.  Cardiovascular:     Rate and Rhythm: Normal rate and regular rhythm.     Heart sounds: Normal heart sounds. No murmur heard.    No friction rub. No gallop.  Pulmonary:     Effort: Pulmonary effort is normal. No tachypnea or respiratory distress.     Breath sounds: Normal breath sounds. No decreased breath sounds, wheezing, rhonchi or rales.  Chest:     Chest wall: No tenderness.  Abdominal:     General: Bowel sounds are normal.     Palpations: Abdomen is soft.  Musculoskeletal:        General: Normal range of motion.     Cervical back: Normal range of motion.  Skin:    General: Skin is warm and dry.  Neurological:     Mental Status: She is alert and oriented to person, place, and time.     Coordination: Coordination normal.  Psychiatric:        Behavior: Behavior normal. Behavior is cooperative.        Thought Content: Thought content normal.        Judgment: Judgment normal.          Patient has been counseled extensively about nutrition and exercise as well as the importance of adherence with medications and regular follow-up. The patient was given clear instructions to go to ER or return to medical center if symptoms don't improve, worsen or new problems develop. The patient verbalized understanding.   Follow-up: Return in about 3 months (around 06/13/2023) for Hypertension .   Claiborne Rigg, FNP-BC [facility] [place], [place] [phone]   03/13/2023, 3:52 PM

## 2023-03-14 LAB — CMP14+EGFR
ALT: 20 IU/L (ref 0–32)
AST: 20 IU/L (ref 0–40)
Albumin/Globulin Ratio: 1.3 (ref 1.2–2.2)
Albumin: 4.2 g/dL (ref 3.8–4.9)
Alkaline Phosphatase: 75 IU/L (ref 44–121)
BUN/Creatinine Ratio: 15 (ref 9–23)
BUN: 15 mg/dL (ref 6–24)
Bilirubin Total: <0.2 mg/dL (ref 0.0–1.2)
CO2: 19 mmol/L — ABNORMAL LOW (ref 20–29)
Calcium: 9.3 mg/dL (ref 8.7–10.2)
Chloride: 103 mmol/L (ref 96–106)
Creatinine, Ser: 0.97 mg/dL (ref 0.57–1.00)
Globulin, Total: 3.3 g/dL (ref 1.5–4.5)
Glucose: 93 mg/dL (ref 70–99)
Potassium: 4.1 mmol/L (ref 3.5–5.2)
Sodium: 139 mmol/L (ref 134–144)
Total Protein: 7.5 g/dL (ref 6.0–8.5)
eGFR: 69 mL/min/{1.73_m2} (ref >59)

## 2023-03-14 LAB — URIC ACID: Uric Acid: 6.4 mg/dL (ref 3.0–7.2)

## 2023-05-02 ENCOUNTER — Encounter: Payer: Self-pay | Admitting: Surgical

## 2023-05-02 ENCOUNTER — Ambulatory Visit: Payer: Medicaid Other | Admitting: Surgical

## 2023-05-02 DIAGNOSIS — M1711 Unilateral primary osteoarthritis, right knee: Secondary | ICD-10-CM

## 2023-05-02 MED ORDER — METHYLPREDNISOLONE ACETATE 40 MG/ML IJ SUSP
40.0000 mg | INTRAMUSCULAR | Status: AC | PRN
Start: 2023-05-02 — End: 2023-05-02
  Administered 2023-05-02: 40 mg via INTRA_ARTICULAR

## 2023-05-02 MED ORDER — LIDOCAINE HCL 1 % IJ SOLN
5.0000 mL | INTRAMUSCULAR | Status: AC | PRN
Start: 2023-05-02 — End: 2023-05-02
  Administered 2023-05-02: 5 mL

## 2023-05-02 MED ORDER — BUPIVACAINE HCL 0.25 % IJ SOLN
4.0000 mL | INTRAMUSCULAR | Status: AC | PRN
Start: 2023-05-02 — End: 2023-05-02
  Administered 2023-05-02: 4 mL via INTRA_ARTICULAR

## 2023-05-02 NOTE — Progress Notes (Signed)
Follow-up Office Visit Note   Patient: Tammy Barajas           Date of Birth: 07-26-1966           MRN: [id number] Visit Date: 05/02/2023 Requested by: Claiborne Rigg, NP [address] [employer] [address] [place],  [place] [address] PCP: Claiborne Rigg, NP  Subjective: Chief Complaint  Patient presents with   Right Knee - Pain, Follow-up    HPI: Tammy Barajas is a 57 y.o. female who returns to the office for follow-up visit.    Plan at last visit was:  Patient is a 57 year old female who presents for evaluation of right knee pain.  She has history of right knee osteoarthritis.  She has large effusion on exam today and would like to try aspiration/injection which has historically provided good relief for her in the past.  Right knee was aspirated of 50 cc of inflammatory, nonpurulent synovial fluid.  Cortisone injection administered.  Patient tolerated procedure well.  Will plan for her to follow-up with the office as needed.   Since then, patient notes patient got about 3.5 months of relief.  Patient states that she still has swelling in her knee but is not as bad as when she was working.  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  She may start working at [employer] as well.  She notes popping but no locking symptoms.  No new injuries.  No groin pain or radicular pain.  Last injection was about 4 months ago.              ROS: All systems reviewed are negative as they relate to the chief complaint within the history of present illness.  Patient denies fevers or chills.  Assessment & Plan: Visit Diagnoses: No diagnosis found.  Plan: Tammy Barajas is a 57 y.o. female who returns to the office for follow-up visit for right knee pain.  Plan from last visit was noted above in HPI.  They now return with significant relief from right knee pain from last injection.  This injection was about 4 months ago.  She would like to repeat injection  today.  Has history of right knee osteoarthritis.  Right knee was aspirated of 20 cc of nonpurulent synovial fluid today.  Cortisone injection administered after aspiration.  Tolerated procedure without complication.  Follow-up with the office as needed with next possible injection 4 months from now.  Follow-Up Instructions: No follow-ups on file.   Orders:  No orders of the defined types were placed in this encounter.  No orders of the defined types were placed in this encounter.     Procedures: Large Joint Inj: R knee on 05/02/2023 7:58 PM Indications: diagnostic evaluation, joint swelling and pain Details: 18 G 1.5 in needle, superolateral approach  Arthrogram: No  Medications: 5 mL lidocaine 1 %; 40 mg methylPREDNISolone acetate 40 MG/ML; 4 mL bupivacaine 0.25 % Aspirate: 20 mL Outcome: tolerated well, no immediate complications Procedure, treatment alternatives, risks and benefits explained, specific risks discussed. Consent was given by the patient. Immediately prior to procedure a time out was called to verify the correct patient, procedure, equipment, support staff and site/side marked as required. Patient was prepped and draped in the usual sterile fashion.       Clinical Data: No additional findings.  Objective: Vital Signs: There were no vitals taken for this visit.  Physical Exam:  Constitutional: Patient  appears well-developed HEENT:  Head: Normocephalic Eyes:EOM are normal Neck: Normal range of motion Cardiovascular: Normal rate Pulmonary/chest: Effort normal Neurologic: Patient is alert Skin: Skin is warm Psychiatric: Patient has normal mood and affect  Ortho Exam: Ortho exam demonstrates right knee with positive effusion.  Able to perform straight leg raise without extensor lag.  Moderate tenderness over the medial joint line, not much tenderness over the lateral joint line.  No calf tenderness.  Negative Homans' sign.  No cellulitis or skin changes noted.   No pain with hip range of motion.  Specialty Comments:  No specialty comments available.  Imaging: No results found.   PMFS History: Patient Active Problem List   Diagnosis Date Noted   Atypical glandular cells of undetermined significance (AGUS) on cervical Pap smear 12/18/2022   Cervical stenosis (uterine cervix) 11/27/2022   Abnormal uterine bleeding 01/31/2022   Hyperlipidemia 01/31/2022   Alopecia 01/31/2022   Class 2 obesity due to excess calories without serious comorbidity with body mass index (BMI) of 38.0 to 38.9 in adult 01/31/2022   Essential hypertension 07/20/2016   Past Medical History:  Diagnosis Date   Gout    Hyperlipidemia    Hypertension     Family History  Problem Relation Age of Onset   Hypertension Mother    Healthy Father    Breast cancer Maternal Aunt    Hypertension Brother     Past Surgical History:  Procedure Laterality Date   ENDOMETRIAL BIOPSY  11/28/2022   Social History   Occupational History   Not on file  Tobacco Use   Smoking status: Never   Smokeless tobacco: Never  Vaping Use   Vaping Use: Never used  Substance and Sexual Activity   Alcohol use: Yes    Comment: occassionally   Drug use: Yes    Frequency: 3.0 times per week    Types: Marijuana    Comment: last use 05/09/15   Sexual activity: Yes    Birth control/protection: None, Condom

## 2023-05-24 IMAGING — US US PELVIS COMPLETE WITH TRANSVAGINAL
1 series · 15 of 25 positions shown · non-contrast
Comparison: Prior ultrasound from 12/20/2006.

CLINICAL DATA: Initial evaluation for amenorrhea.

EXAM:
TRANSABDOMINAL AND TRANSVAGINAL ULTRASOUND OF PELVIS
TECHNIQUE: Both transabdominal and transvaginal ultrasound examinations of the
pelvis were performed. Transabdominal technique was performed for
global imaging of the pelvis including uterus, ovaries, adnexal
regions, and pelvic cul-de-sac. It was necessary to proceed with
endovaginal exam following the transabdominal exam to visualize the
endometrium and ovaries.

[Series 1: us pelvis complete with transvaginal · 15 of 93 slices shown]
[im 1/93]
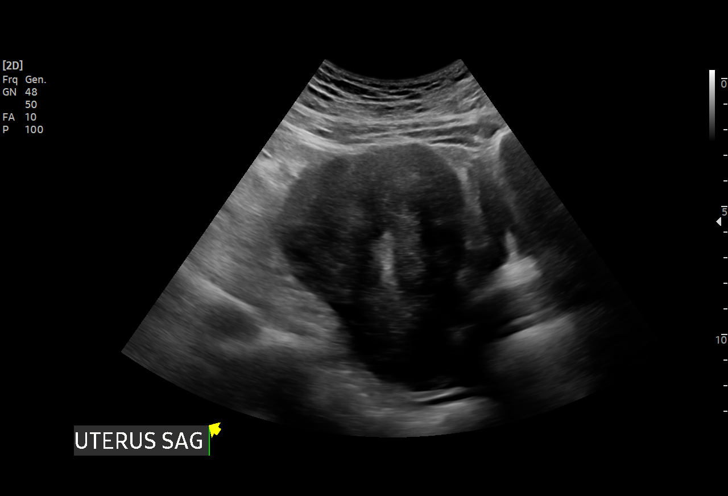
[im 8/93]
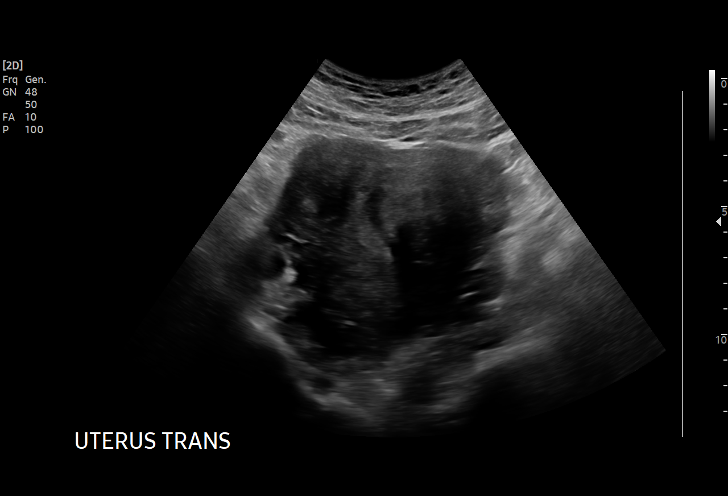
[im 16/93]
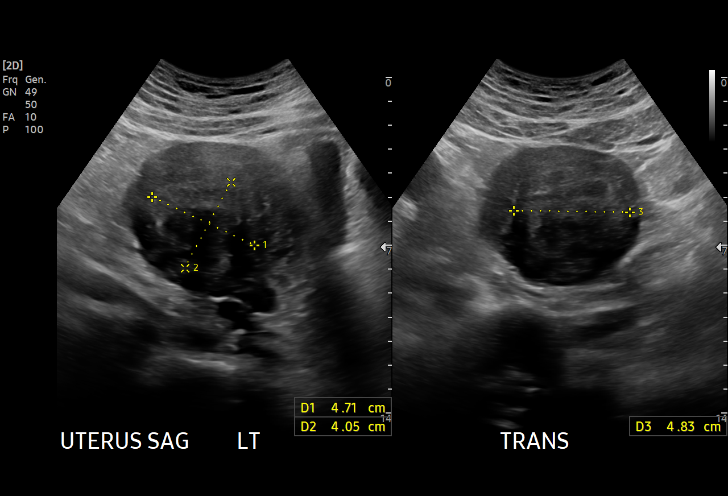
[im 20/93]
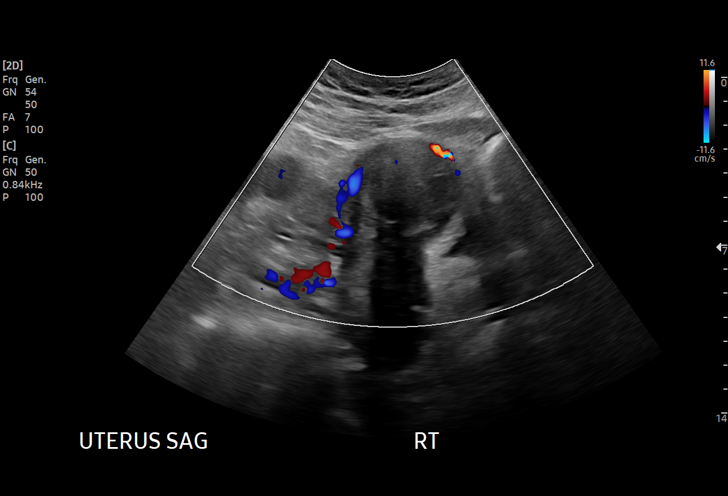
[im 27/93]
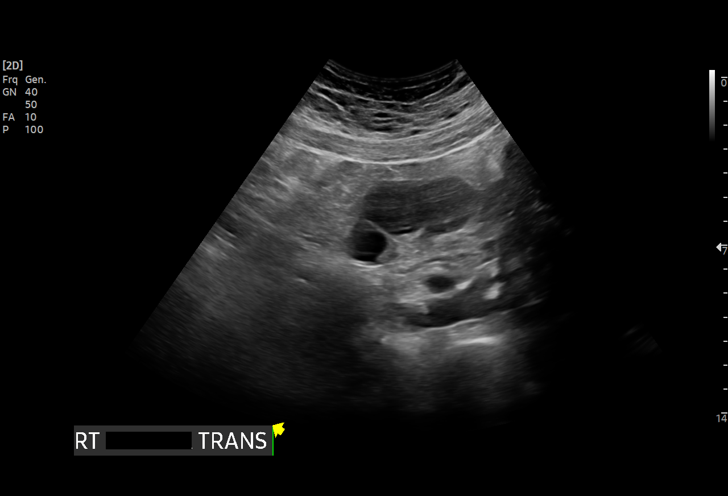
[im 35/93]
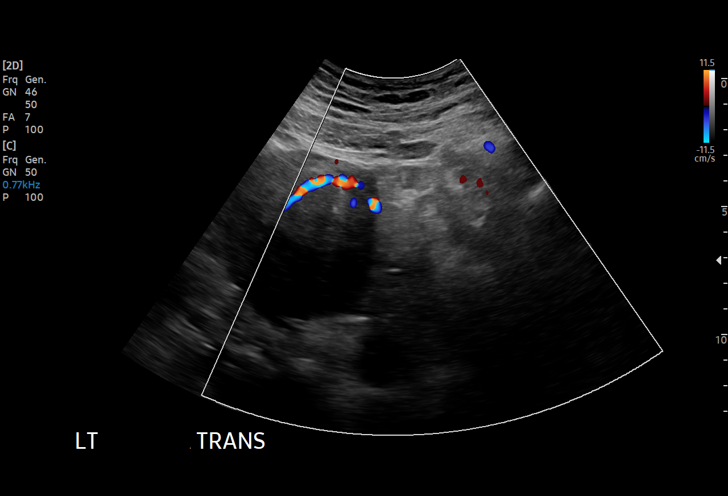
[im 39/93]
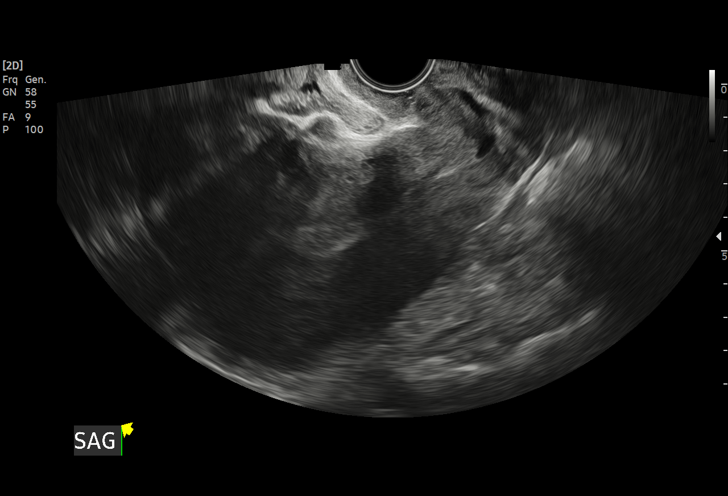
[im 47/93]
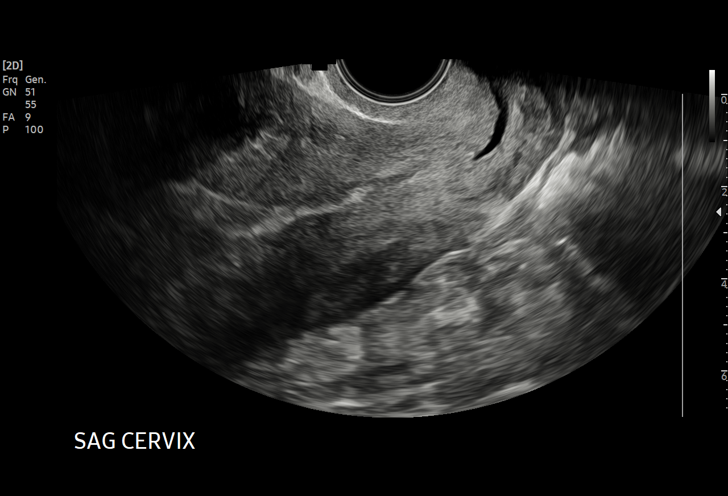
[im 54/93]
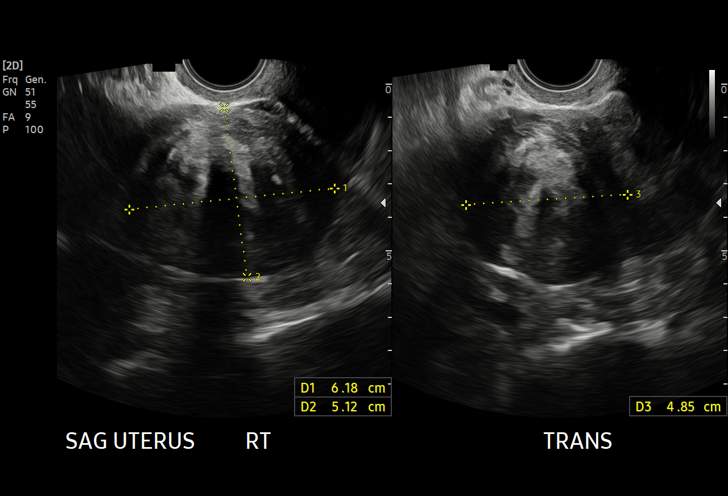
[im 58/93]
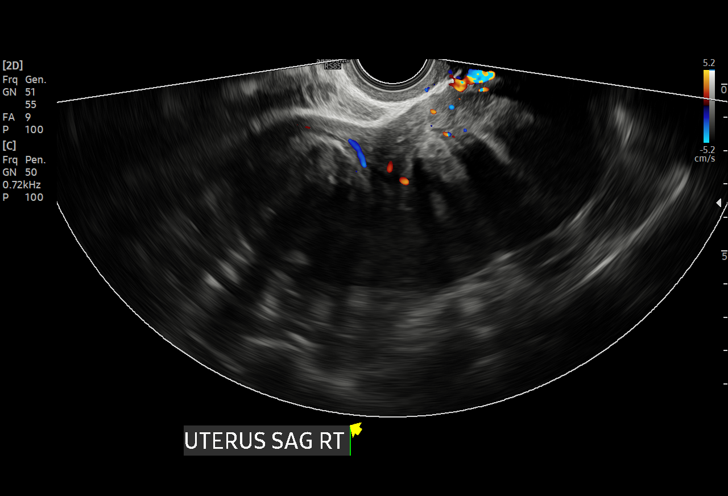
[im 66/93]
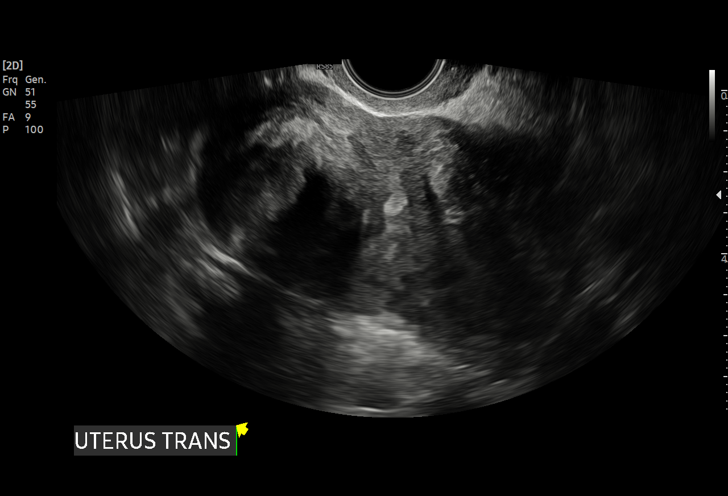
[im 73/93]
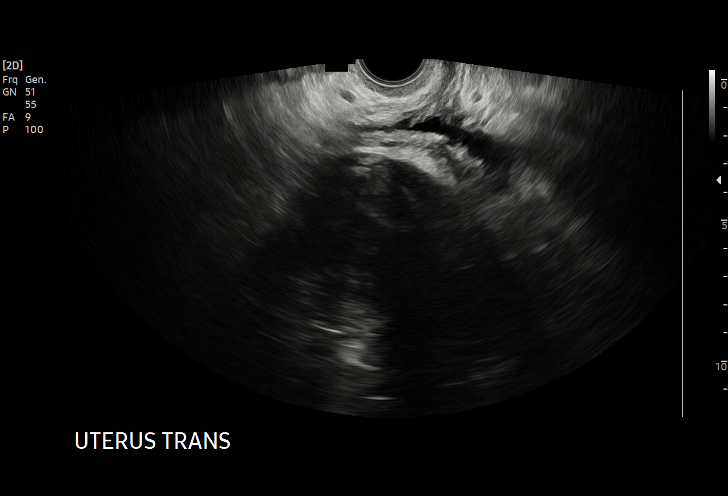
[im 77/93]
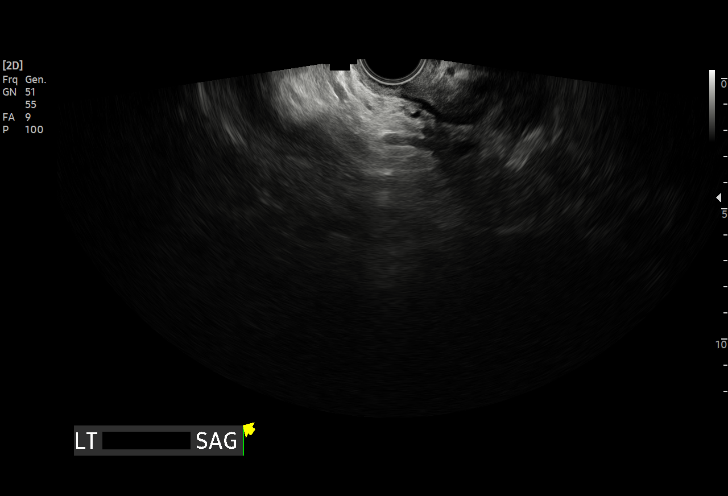
[im 85/93]
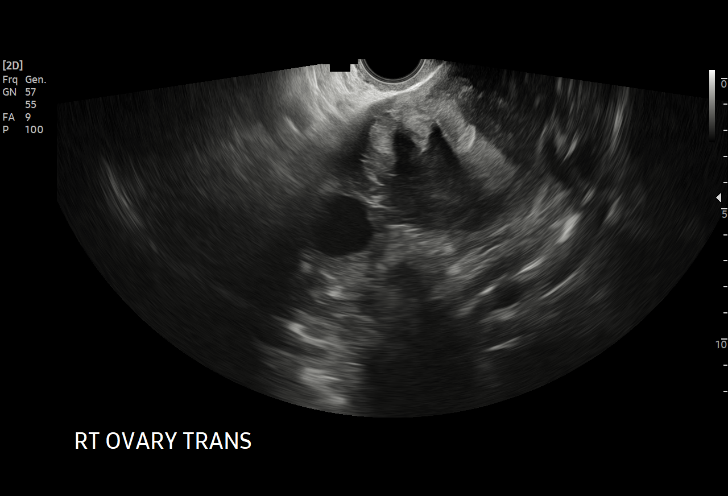
[im 93/93]
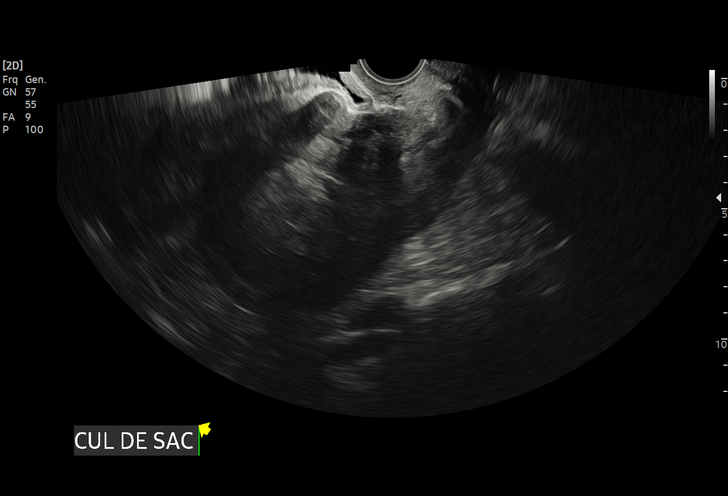

[15 of 25 positions shown; findings below may reference images not displayed]

FINDINGS: Uterus

Measurements: 12.4 x 7.4 x 8.2 cm = volume: 467.4 mL. Uterus is
anteverted. 6.2 x 5.1 x 4.9 cm intramural fibroid present at the
right posterior uterine body. 4.1 x 4.0 x 3.5 cm intramural fibroid
present at the right anterior uterine fundus. 6.1 x 5.4 x 6.1 cm
intramural fibroid present at the left posterior uterine body.

Endometrium

Thickness: 9 mm. No focal abnormality visualized. Small amount of
simple fluid noted within the endocervical canal.

Right ovary

Measurements: 5.2 x 3.9 x 3.7 cm = volume: 39.3 mL. Normal
appearance/no adnexal mass. 2.5 cm dominant follicle noted.

Left ovary

Not visualized.  No adnexal mass.

Other findings

No abnormal free fluid.
IMPRESSION: 1. Three distinct uterine fibroids measuring up to 6.2 cm as above.
2. Normal endometrium.
3. Normal right ovary, with nonvisualization of the left ovary. No
adnexal mass or free fluid.

## 2023-06-13 ENCOUNTER — Ambulatory Visit: Payer: Medicaid Other | Attending: Nurse Practitioner | Admitting: Nurse Practitioner

## 2023-06-13 ENCOUNTER — Encounter: Payer: Self-pay | Admitting: Nurse Practitioner

## 2023-06-13 DIAGNOSIS — I10 Essential (primary) hypertension: Secondary | ICD-10-CM | POA: Diagnosis not present

## 2023-06-13 MED ORDER — BLOOD PRESSURE MONITOR DEVI: 0 refills | Status: AC

## 2023-06-13 MED ORDER — AMLODIPINE BESYLATE 10 MG PO TABS: 10.0000 mg | ORAL_TABLET | Freq: Every day | ORAL | 1 refills | Status: DC

## 2023-06-13 MED ORDER — HYDROCHLOROTHIAZIDE 25 MG PO TABS
25.0000 mg | ORAL_TABLET | Freq: Every day | ORAL | 3 refills | Status: DC
Start: 2023-06-13 — End: 2023-10-22

## 2023-06-13 NOTE — Progress Notes (Signed)
Assessment & Plan:  Tammy Barajas was seen today for follow-up and hypertension.  Diagnoses and all orders for this visit:  Essential hypertension -     amLODipine (NORVASC) 10 MG tablet; Take 1 tablet (10 mg total) by mouth daily. -     hydrochlorothiazide (HYDRODIURIL) 25 MG tablet; Take 1 tablet (25 mg total) by mouth daily. -     Blood Pressure Monitor DEVI; Please provide patient with insurance approved blood pressure monitor. ICD 10 I10.0    Patient has been counseled on age-appropriate routine health concerns for screening and prevention. These are reviewed and up-to-date. Referrals have been placed accordingly. Immunizations are up-to-date or declined.    Subjective:   Chief Complaint  Patient presents with   Follow-up    Knot on finger on left hand   Hypertension   HPI Tammy Barajas 57 y.o. female presents to office today for follow up to HTN  She has a past medical history of medication non adherence , AUB with anemia Hyperlipidemia and Hypertension.      HTN She has not been taking amlodipine and states she has started back taking hydrochlorothiazide. She is aware it was discontinued due to her history of gout however she is adamant this was not the cause of her gout and she is going to continue taking it.  Blood pressure significantly elevated on arrival. She is asymptomatic and endorses non adherence and dietary modifications have not been implemented.  BP Readings from Last 3 Encounters:  06/13/23 (!) 196/121  03/13/23 (!) 142/95  01/24/23 (!) 156/93     Review of Systems  Constitutional:  Negative for fever, malaise/fatigue and weight loss.  HENT: Negative.  Negative for nosebleeds.   Eyes: Negative.  Negative for blurred vision, double vision and photophobia.  Respiratory: Negative.  Negative for cough and shortness of breath.   Cardiovascular: Negative.  Negative for chest pain, palpitations and leg swelling.  Gastrointestinal: Negative.  Negative for  heartburn, nausea and vomiting.  Musculoskeletal: Negative.  Negative for myalgias.  Neurological: Negative.  Negative for dizziness, focal weakness, seizures and headaches.  Psychiatric/Behavioral: Negative.  Negative for suicidal ideas.     Past Medical History:  Diagnosis Date   Gout    Hyperlipidemia    Hypertension     Past Surgical History:  Procedure Laterality Date   ENDOMETRIAL BIOPSY  11/28/2022    Family History  Problem Relation Age of Onset   Hypertension Mother    Healthy Father    Breast cancer Maternal Aunt    Hypertension Brother     Social History Reviewed with no changes to be made today.   Outpatient Medications Prior to Visit  Medication Sig Dispense Refill   Multiple Vitamin (MULTIVITAMIN) tablet Take 1 tablet by mouth daily.     amLODipine (NORVASC) 10 MG tablet Take 1 tablet (10 mg total) by mouth daily. 90 tablet 1   allopurinol (ZYLOPRIM) 100 MG tablet Take 1 tablet (100 mg total) by mouth daily. (Patient not taking: Reported on 03/13/2023) 30 tablet 6   Cholecalciferol (VITAMIN D3 PO) Take by mouth. (Patient not taking: Reported on 06/13/2023)     megestrol (MEGACE) 40 MG tablet Take 1 tablet (40 mg total) by mouth 2 (two) times daily. Can increase to two tablets twice a day in the event of heavy bleeding (Patient not taking: Reported on 06/13/2023) 60 tablet 5   naproxen (NAPROSYN) 500 MG tablet TAKE 1 TABLET BY MOUTH TWICE DAILY WITH A MEAL FOR GOUT  FLARE (Patient not taking: Reported on 01/24/2023) 30 tablet 2   No facility-administered medications prior to visit.    Allergies  Allergen Reactions   Hydrochlorothiazide Other (See Comments)    GOUT   Lisinopril Cough       Objective:    BP (!) 196/121 (BP Location: Right Arm, Patient Position: Sitting, Cuff Size: Large)   Pulse 85   Ht 5\' 7"  (1.702 m)   Wt 240 lb (108.9 kg)   SpO2 99%   BMI 37.59 kg/m  Wt Readings from Last 3 Encounters:  06/13/23 240 lb (108.9 kg)  03/13/23 244 lb  (110.7 kg)  01/24/23 243 lb 1.6 oz (110.3 kg)    Physical Exam Vitals and nursing note reviewed.  Constitutional:      Appearance: She is well-developed.  HENT:     Head: Normocephalic and atraumatic.  Cardiovascular:     Rate and Rhythm: Normal rate and regular rhythm.     Heart sounds: Normal heart sounds. No murmur heard.    No friction rub. No gallop.  Pulmonary:     Effort: Pulmonary effort is normal. No tachypnea or respiratory distress.     Breath sounds: Normal breath sounds. No decreased breath sounds, wheezing, rhonchi or rales.  Chest:     Chest wall: No tenderness.  Abdominal:     General: Bowel sounds are normal.     Palpations: Abdomen is soft.  Musculoskeletal:        General: Normal range of motion.     Cervical back: Normal range of motion.  Skin:    General: Skin is warm and dry.  Neurological:     Mental Status: She is alert and oriented to person, place, and time.     Coordination: Coordination normal.  Psychiatric:        Behavior: Behavior normal. Behavior is cooperative.        Thought Content: Thought content normal.        Judgment: Judgment normal.          Patient has been counseled extensively about nutrition and exercise as well as the importance of adherence with medications and regular follow-up. The patient was given clear instructions to go to ER or return to medical center if symptoms don't improve, worsen or new problems develop. The patient verbalized understanding.   Follow-up: Return in about 4 weeks (around 07/11/2023) for  bp check with me or luke after 330.   Claiborne Rigg, FNP-BC [facility] [place], [place] [phone]   06/13/2023, 3:35 PM

## 2023-07-06 ENCOUNTER — Encounter: Payer: Self-pay | Admitting: Pharmacist

## 2023-07-11 ENCOUNTER — Ambulatory Visit: Payer: Medicaid Other | Attending: Nurse Practitioner | Admitting: Nurse Practitioner

## 2023-07-11 ENCOUNTER — Encounter: Payer: Self-pay | Admitting: Nurse Practitioner

## 2023-07-11 VITALS — BP 160/82 | HR 93 | Ht 67.0 in | Wt 244.6 lb

## 2023-07-11 DIAGNOSIS — I10 Essential (primary) hypertension: Secondary | ICD-10-CM

## 2023-07-11 DIAGNOSIS — Z0120 Encounter for dental examination and cleaning without abnormal findings: Secondary | ICD-10-CM

## 2023-07-11 NOTE — Progress Notes (Signed)
Assessment & Plan:  Tammy Barajas was seen today for medical management of chronic issues.  Diagnoses and all orders for this visit:  Primary hypertension Needs to pick up blood pressure monitor that was ordered for her. Continue all antihypertensives as prescribed.  Reminded to bring in blood pressure log for follow  up appointment.  RECOMMENDATIONS: DASH/Mediterranean Diets are healthier choices for HTN.    Encounter for dental exam and cleaning w/o abnormal findings -     Ambulatory referral to Dentistry    Patient has been counseled on age-appropriate routine health concerns for screening and prevention. These are reviewed and up-to-date. Referrals have been placed accordingly. Immunizations are up-to-date or declined.    Subjective:   Chief Complaint  Patient presents with   Medical Management of Chronic Issues   HPI Tammy Barajas 57 y.o. female presents to office today for follow up to HTN.  She has a past medical history of medication non adherence , AUB with anemia Hyperlipidemia and Hypertension.     HTN Her blood pressure was elevated at her last office visit. I instructed her to return as she stated she had not been adherent with taking her blood pressure medications as prescribed. Today her blood pressure has improved but nowhere near goal. She is adamant that she will not take any other blood pressure medications besides hydrochlorothiazide and amlodipine. I had stopped her hydrochlorothiazide due to gout however she resumed this on her own and requested refills. She is adamant this was not the cause of her gout and states she is going to continue taking it.  She continues dietary nonadherence.  BP Readings from Last 3 Encounters:  07/11/23 (!) 160/82  06/13/23 (!) 196/121  03/13/23 (!) 142/95     Review of Systems  Constitutional:  Negative for fever, malaise/fatigue and weight loss.  HENT: Negative.  Negative for nosebleeds.   Eyes: Negative.  Negative for  blurred vision, double vision and photophobia.  Respiratory: Negative.  Negative for cough and shortness of breath.   Cardiovascular: Negative.  Negative for chest pain, palpitations and leg swelling.  Gastrointestinal: Negative.  Negative for heartburn, nausea and vomiting.  Musculoskeletal: Negative.  Negative for myalgias.  Neurological: Negative.  Negative for dizziness, focal weakness, seizures and headaches.  Psychiatric/Behavioral: Negative.  Negative for suicidal ideas.     Past Medical History:  Diagnosis Date   Gout    Hyperlipidemia    Hypertension     Past Surgical History:  Procedure Laterality Date   ENDOMETRIAL BIOPSY  11/28/2022    Family History  Problem Relation Age of Onset   Hypertension Mother    Healthy Father    Breast cancer Maternal Aunt    Hypertension Brother     Social History Reviewed with no changes to be made today.   Outpatient Medications Prior to Visit  Medication Sig Dispense Refill   allopurinol (ZYLOPRIM) 100 MG tablet Take 1 tablet (100 mg total) by mouth daily. 30 tablet 6   amLODipine (NORVASC) 10 MG tablet Take 1 tablet (10 mg total) by mouth daily. 90 tablet 1   hydrochlorothiazide (HYDRODIURIL) 25 MG tablet Take 1 tablet (25 mg total) by mouth daily. 90 tablet 3   Multiple Vitamin (MULTIVITAMIN) tablet Take 1 tablet by mouth daily.     Blood Pressure Monitor DEVI Please provide patient with insurance approved blood pressure monitor. ICD 10 I10.0 (Patient not taking: Reported on 07/11/2023) 1 each 0   Cholecalciferol (VITAMIN D3 PO) Take by mouth. (Patient  not taking: Reported on 06/13/2023)     megestrol (MEGACE) 40 MG tablet Take 1 tablet (40 mg total) by mouth 2 (two) times daily. Can increase to two tablets twice a day in the event of heavy bleeding (Patient not taking: Reported on 06/13/2023) 60 tablet 5   naproxen (NAPROSYN) 500 MG tablet TAKE 1 TABLET BY MOUTH TWICE DAILY WITH A MEAL FOR GOUT FLARE (Patient not taking: Reported on  01/24/2023) 30 tablet 2   No facility-administered medications prior to visit.    Allergies  Allergen Reactions   Hydrochlorothiazide Other (See Comments)    GOUT   Lisinopril Cough       Objective:    BP (!) 160/82   Pulse 93   Ht 5\' 7"  (1.702 m)   Wt 244 lb 9.6 oz (110.9 kg)   LMP 07/03/2023 (Approximate)   SpO2 99%   BMI 38.31 kg/m  Wt Readings from Last 3 Encounters:  07/11/23 244 lb 9.6 oz (110.9 kg)  06/13/23 240 lb (108.9 kg)  03/13/23 244 lb (110.7 kg)    Physical Exam Vitals and nursing note reviewed.  Constitutional:      Appearance: She is well-developed.  HENT:     Head: Normocephalic and atraumatic.  Cardiovascular:     Rate and Rhythm: Normal rate and regular rhythm.     Heart sounds: Normal heart sounds. No murmur heard.    No friction rub. No gallop.  Pulmonary:     Effort: Pulmonary effort is normal. No tachypnea or respiratory distress.     Breath sounds: Normal breath sounds. No decreased breath sounds, wheezing, rhonchi or rales.  Chest:     Chest wall: No tenderness.  Abdominal:     General: Bowel sounds are normal.     Palpations: Abdomen is soft.  Musculoskeletal:        General: Normal range of motion.     Cervical back: Normal range of motion.  Skin:    General: Skin is warm and dry.  Neurological:     Mental Status: She is alert and oriented to person, place, and time.     Coordination: Coordination normal.  Psychiatric:        Behavior: Behavior normal. Behavior is cooperative.        Thought Content: Thought content normal.        Judgment: Judgment normal.          Patient has been counseled extensively about nutrition and exercise as well as the importance of adherence with medications and regular follow-up. The patient was given clear instructions to go to ER or return to medical center if symptoms don't improve, worsen or new problems develop. The patient verbalized understanding.   Follow-up: Return in about 3 months  (around 10/10/2023).   Claiborne Rigg, FNP-BC [facility] [place], [place] [phone]   07/11/2023, 5:02 PM

## 2023-09-05 ENCOUNTER — Encounter: Payer: Self-pay | Admitting: Surgical

## 2023-09-05 ENCOUNTER — Ambulatory Visit: Payer: Medicaid Other | Admitting: Surgical

## 2023-09-05 DIAGNOSIS — M1711 Unilateral primary osteoarthritis, right knee: Secondary | ICD-10-CM

## 2023-09-05 MED ORDER — BUPIVACAINE HCL 0.25 % IJ SOLN
4.0000 mL | INTRAMUSCULAR | Status: AC | PRN
Start: 2023-09-05 — End: 2023-09-05
  Administered 2023-09-05: 4 mL via INTRA_ARTICULAR

## 2023-09-05 MED ORDER — METHYLPREDNISOLONE ACETATE 40 MG/ML IJ SUSP
40.0000 mg | INTRAMUSCULAR | Status: AC | PRN
Start: 2023-09-05 — End: 2023-09-05
  Administered 2023-09-05: 40 mg via INTRA_ARTICULAR

## 2023-09-05 MED ORDER — LIDOCAINE HCL 1 % IJ SOLN
5.0000 mL | INTRAMUSCULAR | Status: AC | PRN
Start: 2023-09-05 — End: 2023-09-05
  Administered 2023-09-05: 5 mL

## 2023-09-05 NOTE — Progress Notes (Signed)
Office Visit Note   Patient: Tammy Barajas           Date of Birth: 1966-10-06           MRN: [id number] Visit Date: 09/05/2023 Requested by: Claiborne Rigg, NP [address] [place],  [place] [address] PCP: Claiborne Rigg, NP  Subjective: Chief Complaint  Patient presents with   Right Knee - Pain    Last steroid injection 05/02/23    HPI: Tammy Barajas is a 57 y.o. female who presents to the office reporting knee pain.  Has history of knee arthritis.  No new falls or injuries.  No fevers or chills.  Previous injections have provided good relief and they are here today to repeat injection.  She has changed jobs from working at [employer] school to [employer] which involves a little bit more walking which is actually helping her knee pain.  Continues to complain of lateral pain.  Uses a knee brace at work.  Not taking any medications for pain..                ROS: All systems reviewed are negative as they relate to the chief complaint within the history of present illness.  Patient denies fevers or chills.  Assessment & Plan: Visit Diagnoses: No diagnosis found.  Plan: Patient is a 57 year old female who presents for evaluation of right knee pain.  Has history of right knee arthritis with last aspiration injection on 05/02/2023 that gave her great relief.  Overall her knee pain is actually trending better and not worse with her new job that involves a little bit more walking and less just standing still for long periods of time.  After discussion, she would still like to go ahead with right knee aspiration injection.  15 cc was aspirated and cortisone injection administered.  Patient will follow-up in about 4 months to repeat injection as needed.  Follow-Up Instructions: No follow-ups on file.   Orders:  No orders of the defined types were placed in this encounter.  No orders of the defined types were placed in this encounter.     Procedures: Large Joint  Inj: R knee on 09/05/2023 5:24 PM Indications: diagnostic evaluation, joint swelling and pain Details: 18 G 1.5 in needle, superolateral approach  Arthrogram: No  Medications: 5 mL lidocaine 1 %; 40 mg methylPREDNISolone acetate 40 MG/ML; 4 mL bupivacaine 0.25 % Aspirate: 15 mL Outcome: tolerated well, no immediate complications Procedure, treatment alternatives, risks and benefits explained, specific risks discussed. Consent was given by the patient. Immediately prior to procedure a time out was called to verify the correct patient, procedure, equipment, support staff and site/side marked as required. Patient was prepped and draped in the usual sterile fashion.       Clinical Data: No additional findings.  Objective: Vital Signs: There were no vitals taken for this visit.  Physical Exam:  Constitutional: Patient appears well-developed HEENT:  Head: Normocephalic Eyes:EOM are normal Neck: Normal range of motion Cardiovascular: Normal rate Pulmonary/chest: Effort normal Neurologic: Patient is alert Skin: Skin is warm Psychiatric: Patient has normal mood and affect  Ortho Exam: Ortho exam demonstrates knees without cellulitis or skin changes.  Effusion present.  No calf tenderness.  Negative Homans' sign.  No pain with hip range of motion.  Able to perform straight leg raise with both lower extremities.  Lower extremities warm and well-perfused.  Tenderness over the medial lateral joint line mildly.  Specialty Comments:  No specialty comments available.  Imaging: No results found.   PMFS History: Patient Active Problem List   Diagnosis Date Noted   Atypical glandular cells of undetermined significance (AGUS) on cervical Pap smear 12/18/2022   Cervical stenosis (uterine cervix) 11/27/2022   Abnormal uterine bleeding 01/31/2022   Hyperlipidemia 01/31/2022   Alopecia 01/31/2022   Class 2 obesity due to excess calories without serious comorbidity with body mass index (BMI)  of 38.0 to 38.9 in adult 01/31/2022   Essential hypertension 07/20/2016   Past Medical History:  Diagnosis Date   Gout    Hyperlipidemia    Hypertension     Family History  Problem Relation Age of Onset   Hypertension Mother    Healthy Father    Breast cancer Maternal Aunt    Hypertension Brother     Past Surgical History:  Procedure Laterality Date   ENDOMETRIAL BIOPSY  11/28/2022   Social History   Occupational History   Not on file  Tobacco Use   Smoking status: Never   Smokeless tobacco: Never  Vaping Use   Vaping status: Never Used  Substance and Sexual Activity   Alcohol use: Yes    Comment: occassionally   Drug use: Yes    Frequency: 3.0 times per week    Types: Marijuana    Comment: last use 05/09/15   Sexual activity: Yes    Birth control/protection: None, Condom

## 2023-10-15 ENCOUNTER — Telehealth: Payer: Self-pay | Admitting: General Practice

## 2023-10-15 NOTE — Telephone Encounter (Signed)
Patient called and left voicemail message stating her megace refill needs a PA submitted through cover my meds.   PA submitted. Patient called & notified. Patient verbalized understanding.

## 2023-10-18 ENCOUNTER — Telehealth: Payer: Self-pay | Admitting: Lactation Services

## 2023-10-18 NOTE — Telephone Encounter (Signed)
Tammy Barajas (KeyIan Bushman) Rx #: [id number] Need Help? Call us at [phone] Outcome Additional Information Required Available without authorization. Drug Megestrol Acetate 40MG  tablets ePA cloud logo Form [facility] [place] Form [phone] NCPDP) Original Claim Info [id number] FOR 3 DS O/R, USE [facility] [id number] NON-FORMULARY DRUG, CONTACT PRESCRIBERMAXIMUM DAYS SUPPLY OF 3

## 2023-10-18 NOTE — Telephone Encounter (Signed)
PA for Megace submitted via Covermymeds. Awaiting determination.    Michaeline Gernert (KeyIan Bushman) Rx #: [id number] Need Help? Call us at [phone] Status sent iconSent to Plan today Drug Megestrol Acetate 40MG  tablets ePA cloud logo Form [facility] [place] Form [phone] NCPDP) Original Claim Info [id number] FOR 3 DS O/R, USE [facility] [id number] NON-FORMULARY DRUG, CONTACT PRESCRIBERMAXIMUM DAYS SUPPLY OF 3

## 2023-10-18 NOTE — Telephone Encounter (Signed)
Called Pharmacy for Megace PA, was told that did not need PA. They report the only Rx they have for her was a BP cuff and has previously been refilled.   [facility] and informed then that Megace does not need a PA and they report medication is ready for pick up.

## 2023-10-22 ENCOUNTER — Encounter: Payer: Self-pay | Admitting: Nurse Practitioner

## 2023-10-22 ENCOUNTER — Ambulatory Visit: Payer: Medicaid Other | Attending: Nurse Practitioner | Admitting: Nurse Practitioner

## 2023-10-22 VITALS — BP 159/83 | HR 83 | Ht 67.0 in | Wt 242.6 lb

## 2023-10-22 DIAGNOSIS — Z1211 Encounter for screening for malignant neoplasm of colon: Secondary | ICD-10-CM

## 2023-10-22 DIAGNOSIS — I10 Essential (primary) hypertension: Secondary | ICD-10-CM

## 2023-10-22 DIAGNOSIS — Z1231 Encounter for screening mammogram for malignant neoplasm of breast: Secondary | ICD-10-CM | POA: Diagnosis not present

## 2023-10-22 DIAGNOSIS — Z7689 Persons encountering health services in other specified circumstances: Secondary | ICD-10-CM | POA: Diagnosis not present

## 2023-10-22 MED ORDER — HYDROCHLOROTHIAZIDE 25 MG PO TABS: 25.0000 mg | ORAL_TABLET | Freq: Every day | ORAL | 3 refills | Status: AC

## 2023-10-22 MED ORDER — AMLODIPINE BESYLATE 10 MG PO TABS: 10.0000 mg | ORAL_TABLET | Freq: Every day | ORAL | 1 refills | Status: DC

## 2023-10-22 NOTE — Progress Notes (Signed)
Assessment & Plan:  Diagnoses and all orders for this visit:  Primary hypertension -     CMP14+EGFR Continue all antihypertensives as prescribed.  Reminded to bring in blood pressure log for follow  up appointment.  RECOMMENDATIONS: DASH/Mediterranean Diets are healthier choices for HTN.    Breast cancer screening by mammogram -     Cancel: MM 3D DIAGNOSTIC MAMMOGRAM BILATERAL BREAST; Future -     MM 3D SCREENING MAMMOGRAM BILATERAL BREAST; Future  Colon cancer screening -     Ambulatory referral to Gastroenterology  Poor dentition requiring referral to dentistry -     Ambulatory referral to Dentistry    Patient has been counseled on age-appropriate routine health concerns for screening and prevention. These are reviewed and up-to-date. Referrals have been placed accordingly. Immunizations are up-to-date or declined.    Subjective:   Chief Complaint  Patient presents with   Hypertension    Tammy Barajas 57 y.o. female presents to office today for follow up to HTN.   She has a PMH of HTN, HPL and GOUT  Patient has been counseled on age-appropriate routine health concerns for screening and prevention. These are reviewed and up-to-date. Referrals have been placed accordingly. Immunizations are up-to-date or declined.     PAP SMEAR: UTD COLONOSCOPY: OVERDUE. Referral placed MAMMOGRAM: UTD. Referral placed as new due date approaching.    HTN BLood pressure is not at goal due to medication nonadherence. She is only taking hydrochlorothiazide as needed and skipping doses of amlodipine.  Declines to take any other medications to help lower her blood pressure. She is aware of the risks of poorly controlled HTN including stroke, MI, death, renal failure.   BP Readings from Last 3 Encounters:  10/22/23 (!) 157/99  07/11/23 (!) 160/82  06/13/23 (!) 196/121     Review of Systems  Constitutional:  Negative for fever, malaise/fatigue and weight loss.  HENT: Negative.   Negative for nosebleeds.   Eyes: Negative.  Negative for blurred vision, double vision and photophobia.  Respiratory: Negative.  Negative for cough and shortness of breath.   Cardiovascular: Negative.  Negative for chest pain, palpitations and leg swelling.  Gastrointestinal: Negative.  Negative for heartburn, nausea and vomiting.  Musculoskeletal: Negative.  Negative for myalgias.  Neurological: Negative.  Negative for dizziness, focal weakness, seizures and headaches.  Psychiatric/Behavioral: Negative.  Negative for suicidal ideas.     Past Medical History:  Diagnosis Date   Gout    Hyperlipidemia    Hypertension     Past Surgical History:  Procedure Laterality Date   ENDOMETRIAL BIOPSY  11/28/2022    Family History  Problem Relation Age of Onset   Hypertension Mother    Healthy Father    Breast cancer Maternal Aunt    Hypertension Brother     Social History Reviewed with no changes to be made today.   Outpatient Medications Prior to Visit  Medication Sig Dispense Refill   allopurinol (ZYLOPRIM) 100 MG tablet Take 1 tablet (100 mg total) by mouth daily. 30 tablet 6   Blood Pressure Monitor DEVI Please provide patient with insurance approved blood pressure monitor. ICD 10 I10.0 1 each 0   Cholecalciferol (VITAMIN D3 PO) Take by mouth.     megestrol (MEGACE) 40 MG tablet Take 1 tablet (40 mg total) by mouth 2 (two) times daily. Can increase to two tablets twice a day in the event of heavy bleeding 60 tablet 5   Multiple Vitamin (MULTIVITAMIN) tablet Take 1 tablet  by mouth daily.     amLODipine (NORVASC) 10 MG tablet Take 1 tablet (10 mg total) by mouth daily. 90 tablet 1   hydrochlorothiazide (HYDRODIURIL) 25 MG tablet Take 1 tablet (25 mg total) by mouth daily. 90 tablet 3   naproxen (NAPROSYN) 500 MG tablet TAKE 1 TABLET BY MOUTH TWICE DAILY WITH A MEAL FOR GOUT FLARE (Patient not taking: Reported on 10/22/2023) 30 tablet 2   No facility-administered medications prior to  visit.    Allergies  Allergen Reactions   Hydrochlorothiazide Other (See Comments)    GOUT   Lisinopril Cough       Objective:    BP (!) 157/99 (BP Location: Left Arm, Patient Position: Sitting, Cuff Size: Large)   Pulse 81   Ht 5\' 7"  (1.702 m)   Wt 242 lb 9.6 oz (110 kg)   SpO2 98%   BMI 38.00 kg/m  Wt Readings from Last 3 Encounters:  10/22/23 242 lb 9.6 oz (110 kg)  07/11/23 244 lb 9.6 oz (110.9 kg)  06/13/23 240 lb (108.9 kg)    Physical Exam Vitals and nursing note reviewed.  Constitutional:      Appearance: She is well-developed.  HENT:     Head: Normocephalic and atraumatic.  Cardiovascular:     Rate and Rhythm: Normal rate and regular rhythm.     Heart sounds: Normal heart sounds. No murmur heard.    No friction rub. No gallop.  Pulmonary:     Effort: Pulmonary effort is normal. No tachypnea or respiratory distress.     Breath sounds: Normal breath sounds. No decreased breath sounds, wheezing, rhonchi or rales.  Chest:     Chest wall: No tenderness.  Abdominal:     General: Bowel sounds are normal.     Palpations: Abdomen is soft.  Musculoskeletal:        General: Normal range of motion.     Cervical back: Normal range of motion.  Skin:    General: Skin is warm and dry.  Neurological:     Mental Status: She is alert and oriented to person, place, and time.     Coordination: Coordination normal.  Psychiatric:        Behavior: Behavior normal. Behavior is cooperative.        Thought Content: Thought content normal.        Judgment: Judgment normal.          Patient has been counseled extensively about nutrition and exercise as well as the importance of adherence with medications and regular follow-up. The patient was given clear instructions to go to ER or return to medical center if symptoms don't improve, worsen or new problems develop. The patient verbalized understanding.   Follow-up: Return in about 3 months (around 01/20/2024).   Claiborne Rigg, FNP-BC [facility] [place], [place] [phone]   10/22/2023, 4:10 PM

## 2023-10-22 NOTE — Patient Instructions (Addendum)
[facility] Located in: [facility] Address: [address], [place], [place] [address] Phone: [phone]    [facility] Located in: [facility] [address] Address: [address], [place], [place] [address] Phone: [phone]

## 2023-10-23 LAB — CMP14+EGFR
ALT: 15 [IU]/L (ref 0–32)
AST: 18 [IU]/L (ref 0–40)
Albumin: 4 g/dL (ref 3.8–4.9)
Alkaline Phosphatase: 75 [IU]/L (ref 44–121)
BUN/Creatinine Ratio: 18 (ref 9–23)
BUN: 17 mg/dL (ref 6–24)
Bilirubin Total: 0.3 mg/dL (ref 0.0–1.2)
CO2: 19 mmol/L — ABNORMAL LOW (ref 20–29)
Calcium: 9.6 mg/dL (ref 8.7–10.2)
Chloride: 105 mmol/L (ref 96–106)
Creatinine, Ser: 0.96 mg/dL (ref 0.57–1.00)
Globulin, Total: 3.3 g/dL (ref 1.5–4.5)
Glucose: 84 mg/dL (ref 70–99)
Potassium: 4.1 mmol/L (ref 3.5–5.2)
Sodium: 142 mmol/L (ref 134–144)
Total Protein: 7.3 g/dL (ref 6.0–8.5)
eGFR: 70 mL/min/{1.73_m2} (ref >59)

## 2023-11-21 ENCOUNTER — Telehealth: Payer: Self-pay

## 2023-11-21 NOTE — Telephone Encounter (Signed)
Patient was called and informed to reach out to her insurance company and ask for listing of in network providers.      Copied from CRM [id number]. Topic: Referral - Question >> Nov 20, 2023  3:06 PM Fonda Kinder J wrote: Reason for CRM: Pt was referred to a dentist but she states they won't see her until May and she was told to call back so her PCP can send a new referral somewhere else so that she can be seen before then. She also wants to know if she could be referred to Dr. Ocie Doyne at [facility]

## 2023-12-20 ENCOUNTER — Ambulatory Visit: Payer: Self-pay | Admitting: Nurse Practitioner

## 2023-12-20 NOTE — Telephone Encounter (Signed)
  Chief Complaint: hypertension Symptoms: none Frequency: 3x today   Disposition: [x] ED /[] Urgent Care (no appt availability in office) / [] Appointment(In office/virtual)/ []  [place] [facility]/ [] Home Care/ [] Refused Recommended Disposition /[] [place] [facility]/ []  Follow-up with PCP Additional Notes: Pt calling with high blood pressure readings. Pt was at the dentist today to get teeth pulled but before procedure BP was checked. Pt had the readings of 189/115, 171/101, and 156/92. Dentist would not pull teeth and had to reschedule until pt notified provider. Pt denies any headache, SOB, or chest pain. Pt is very adamant on the cause of high readings. Pt ate a microwaved pot pie for lunch and feels the sodium caused the spike.  Per protocol, RN advised pt to go to ED. Pt refuses. "I'm going to relax and drink water. I'm fine. I know what caused this." Pt states she has taken BP meds as prescribed. RN attempted to notify [facility] of refusal. Unable to reach staff or leave message.           Copied from CRM [id number]. Topic: Clinical - Red Word Triage >> Dec 20, 2023  4:34 PM Turkey B wrote: Pt bp is high Reason for Disposition  [1] Systolic BP  >= 160 OR Diastolic >= 100 AND [2] cardiac (e.g., breathing difficulty, chest pain) or neurologic symptoms (e.g., new-onset blurred or double vision, unsteady gait)  Answer Assessment - Initial Assessment Questions 1. BLOOD PRESSURE: "What is the blood pressure?" "Did you take at least two measurements 5 minutes apart?"     189/115; 171/101 2. ONSET: "When did you take your blood pressure?"     3p today  3. HOW: "How did you take your blood pressure?" (e.g., automatic home BP monitor, visiting nurse)     Automatically  4. HISTORY: "Do you have a history of high blood pressure?"     yes 5. MEDICINES: "Are you taking any medicines for blood pressure?" "Have you missed any doses recently?"     Amlodipine and HTZ 6. OTHER SYMPTOMS: "Do you  have any symptoms?" (e.g., blurred vision, chest pain, difficulty breathing, headache, weakness)     Denies  Protocols used: Blood Pressure - High-A-AH

## 2023-12-21 NOTE — Telephone Encounter (Signed)
 Noted. She has a longstanding history of medication non adherence and dietary noncompliance. I have an extremely difficult time managing her blood pressure as she refuses adjustments and additions to her blood pressure medication regimen. We have discussed the complications of poorly controlled blood pressure.

## 2024-01-02 ENCOUNTER — Other Ambulatory Visit: Payer: Self-pay | Admitting: Obstetrics and Gynecology

## 2024-01-02 ENCOUNTER — Ambulatory Visit: Payer: Medicaid Other | Admitting: Surgical

## 2024-01-02 ENCOUNTER — Encounter: Payer: Self-pay | Admitting: Surgical

## 2024-01-02 DIAGNOSIS — M1711 Unilateral primary osteoarthritis, right knee: Secondary | ICD-10-CM | POA: Diagnosis not present

## 2024-01-02 MED ORDER — BUPIVACAINE HCL 0.25 % IJ SOLN
4.0000 mL | INTRAMUSCULAR | Status: AC | PRN
  Administered 2024-01-02: 4 mL via INTRA_ARTICULAR

## 2024-01-02 MED ORDER — METHYLPREDNISOLONE ACETATE 40 MG/ML IJ SUSP
40.0000 mg | INTRAMUSCULAR | Status: AC | PRN
  Administered 2024-01-02: 40 mg via INTRA_ARTICULAR

## 2024-01-02 MED ORDER — LIDOCAINE HCL 1 % IJ SOLN
5.0000 mL | INTRAMUSCULAR | Status: AC | PRN
  Administered 2024-01-02: 5 mL

## 2024-01-02 NOTE — Progress Notes (Signed)
 Office Visit Note   Patient: Tammy Barajas           Date of Birth: 08/05/1966           MRN: [id number] Visit Date: 01/02/2024 Requested by: Claiborne Rigg, NP [address] [place] [address] [place],  [place] [address] PCP: Claiborne Rigg, NP  Subjective: Chief Complaint  Patient presents with   Right Knee - Pain    HPI: Tammy Barajas is a 58 y.o. female who presents to the office reporting knee pain.  Has history of knee arthritis.  No new falls or injuries.  No fevers or chills.  Previous injections have provided good relief and they are here today to repeat injections. Last injection lasted for 3-4 months.  .                ROS: All systems reviewed are negative as they relate to the chief complaint within the history of present illness.  Patient denies fevers or chills.  Assessment & Plan: Visit Diagnoses:  1. Unilateral primary osteoarthritis, right knee     Plan: Patient is a 58 year old female who returns for reevaluation of right knee pain.  Had right knee aspiration/injection at last visit that gave her great relief for about 3 to 4 months.  She would like to repeat this today.  30 cc of nonpurulent synovial fluid was aspirated from the right knee with cortisone injection administered.  Patient tolerated procedure well.  Follow-up as needed with next possible injection in about 4 months.  Follow-Up Instructions: No follow-ups on file.   Orders:  No orders of the defined types were placed in this encounter.  No orders of the defined types were placed in this encounter.     Procedures: Large Joint Inj: R knee on 01/02/2024 5:15 PM Indications: diagnostic evaluation, joint swelling and pain Details: 18 G 1.5 in needle, superolateral approach  Arthrogram: No  Medications: 5 mL lidocaine 1 %; 40 mg methylPREDNISolone acetate 40 MG/ML; 4 mL bupivacaine 0.25 % Aspirate: 30 mL Outcome: tolerated well, no immediate complications Procedure, treatment alternatives,  risks and benefits explained, specific risks discussed. Consent was given by the patient. Immediately prior to procedure a time out was called to verify the correct patient, procedure, equipment, support staff and site/side marked as required. Patient was prepped and draped in the usual sterile fashion.       Clinical Data: No additional findings.  Objective: Vital Signs: There were no vitals taken for this visit.  Physical Exam:  Constitutional: Patient appears well-developed HEENT:  Head: Normocephalic Eyes:EOM are normal Neck: Normal range of motion Cardiovascular: Normal rate Pulmonary/chest: Effort normal Neurologic: Patient is alert Skin: Skin is warm Psychiatric: Patient has normal mood and affect  Ortho Exam: Ortho exam demonstrates knees without cellulitis or skin changes.  Effusion present.  No calf tenderness.  Negative Homans' sign.  No pain with hip range of motion.  Able to perform straight leg raise with both lower extremities.  Lower extremities warm and well-perfused.   Specialty Comments:  No specialty comments available.  Imaging: No results found.   PMFS History: Patient Active Problem List   Diagnosis Date Noted   Atypical glandular cells of undetermined significance (AGUS) on cervical Pap smear 12/18/2022   Cervical stenosis (uterine cervix) 11/27/2022   Abnormal uterine bleeding 01/31/2022   Hyperlipidemia 01/31/2022   Alopecia 01/31/2022   Class 2 obesity due to excess calories without serious comorbidity with body mass index (BMI)  of 38.0 to 38.9 in adult 01/31/2022   Essential hypertension 07/20/2016   Past Medical History:  Diagnosis Date   Gout    Hyperlipidemia    Hypertension     Family History  Problem Relation Age of Onset   Hypertension Mother    Healthy Father    Breast cancer Maternal Aunt    Hypertension Brother     Past Surgical History:  Procedure Laterality Date   ENDOMETRIAL BIOPSY  11/28/2022   Social History    Occupational History   Not on file  Tobacco Use   Smoking status: Never   Smokeless tobacco: Never  Vaping Use   Vaping status: Never Used  Substance and Sexual Activity   Alcohol use: Yes    Comment: occassionally   Drug use: Yes    Frequency: 3.0 times per week    Types: Marijuana    Comment: last use 05/09/15   Sexual activity: Yes    Birth control/protection: None, Condom

## 2024-01-09 ENCOUNTER — Other Ambulatory Visit: Payer: Self-pay | Admitting: Family Medicine

## 2024-01-09 ENCOUNTER — Telehealth: Payer: Self-pay | Admitting: *Deleted

## 2024-01-09 NOTE — Telephone Encounter (Signed)
 Mackenize left a voicemail she called Monday about getting her megestrol for her bleeding and hasn't heard anything. I called Isla and she says she is just spotting but ran out of Megace and called pharmacy and told her wait 2 days. I informed her we had sent in rx 12/17/23 and asked how much and often she is taking it. She states she has been taking 2 pills twice a day. I explained the instructions were to take 1 pill BID and may increase to 2 pills BID if heavy bleeding. I explained that is why she ran out early and that insurance will only cover new rx when new rx is expected to be needed. I explained they may have meant she can get it in 2 days. I asked her to call pharmacy back and see when she can get the medication and if it more than 2 days to call us and we will see if we can get a new rx sent in changing dose to 2 pills bid. She voices understanding. Nancy Fetter

## 2024-01-10 ENCOUNTER — Other Ambulatory Visit: Payer: Self-pay | Admitting: *Deleted

## 2024-01-10 MED ORDER — MEGESTROL ACETATE 40 MG PO TABS
40.0000 mg | ORAL_TABLET | Freq: Two times a day (BID) | ORAL | 5 refills | Status: AC
Start: 2024-01-10 — End: ?

## 2024-01-10 NOTE — Progress Notes (Signed)
 Pt left phone message stating she is having heavy bleeding and needs refill of Megace. Per chart review, pt's previous Rx has expired. Refill request approved by Dr. Crissie Reese and was e-prescribed. Pt was notified.

## 2024-01-21 ENCOUNTER — Encounter: Payer: Self-pay | Admitting: Nurse Practitioner

## 2024-01-21 ENCOUNTER — Ambulatory Visit: Payer: Medicaid Other | Attending: Nurse Practitioner | Admitting: Nurse Practitioner

## 2024-01-21 VITALS — BP 141/90 | HR 90 | Resp 19 | Ht 67.0 in | Wt 240.0 lb

## 2024-01-21 DIAGNOSIS — I10 Essential (primary) hypertension: Secondary | ICD-10-CM

## 2024-01-21 NOTE — Progress Notes (Signed)
 Assessment & Plan:  Tammy Barajas was seen today for medical management of chronic issues.  Diagnoses and all orders for this visit:  Primary hypertension She adamantly declines switching her amlodipine to azor today.     Patient has been counseled on age-appropriate routine health concerns for screening and prevention. These are reviewed and up-to-date. Referrals have been placed accordingly. Immunizations are up-to-date or declined.    Subjective:   Chief Complaint  Patient presents with   Medical Management of Chronic Issues    Tammy Barajas 58 y.o. female presents to office today for follow-up to hypertension.   She has a PMH of HTN, HPL and GOUT   Patient has been counseled on age-appropriate routine health concerns for screening and prevention. These are reviewed and up-to-date. Referrals have been placed accordingly. Immunizations are up-to-date or declined.     PAP SMEAR: UTD COLONOSCOPY: OVERDUE. Referral placed MAMMOGRAM: UTD. Referral placed as new due date approaching.      HTN BLood pressure is not at goal due to medication nonadherence. She is only taking hydrochlorothiazide as needed but states she has been taking amlodipine as prescribed.  Declines to take any other medications to help lower her blood pressure. I offered azor today and she states this will be too strong for her.  She is aware of the risks of poorly controlled HTN including stroke, MI, death, renal failure.  She was recently unable to have her teeth extracted due to severe hypertension 189/110. She states hydrochlorothiazide makes her jaw feel numb.  BP Readings from Last 3 Encounters:  01/21/24 (!) 141/90  10/22/23 (!) 159/83  07/11/23 (!) 160/82       Review of Systems  Constitutional:  Negative for fever, malaise/fatigue and weight loss.  HENT: Negative.  Negative for nosebleeds.   Eyes: Negative.  Negative for blurred vision, double vision and photophobia.  Respiratory: Negative.   Negative for cough and shortness of breath.   Cardiovascular: Negative.  Negative for chest pain, palpitations and leg swelling.  Gastrointestinal: Negative.  Negative for heartburn, nausea and vomiting.  Musculoskeletal: Negative.  Negative for myalgias.  Neurological: Negative.  Negative for dizziness, focal weakness, seizures and headaches.  Psychiatric/Behavioral: Negative.  Negative for suicidal ideas.     Past Medical History:  Diagnosis Date   Gout    Hyperlipidemia    Hypertension     Past Surgical History:  Procedure Laterality Date   ENDOMETRIAL BIOPSY  11/28/2022    Family History  Problem Relation Age of Onset   Hypertension Mother    Healthy Father    Breast cancer Maternal Aunt    Hypertension Brother     Social History Reviewed with no changes to be made today.   Outpatient Medications Prior to Visit  Medication Sig Dispense Refill   allopurinol (ZYLOPRIM) 100 MG tablet Take 1 tablet (100 mg total) by mouth daily. 30 tablet 6   amLODipine (NORVASC) 10 MG tablet Take 1 tablet (10 mg total) by mouth daily. 90 tablet 1   Blood Pressure Monitor DEVI Please provide patient with insurance approved blood pressure monitor. ICD 10 I10.0 1 each 0   hydrochlorothiazide (HYDRODIURIL) 25 MG tablet Take 1 tablet (25 mg total) by mouth daily. 90 tablet 3   megestrol (MEGACE) 40 MG tablet Take 1 tablet (40 mg total) by mouth 2 (two) times daily. Can increase to two tablets twice a day in the event of heavy bleeding 60 tablet 5   Multiple Vitamin (MULTIVITAMIN) tablet Take  1 tablet by mouth daily.     naproxen (NAPROSYN) 500 MG tablet TAKE 1 TABLET BY MOUTH TWICE DAILY WITH A MEAL FOR GOUT FLARE 30 tablet 2   Cholecalciferol (VITAMIN D3 PO) Take by mouth. (Patient not taking: Reported on 01/21/2024)     No facility-administered medications prior to visit.    Allergies  Allergen Reactions   Hydrochlorothiazide Other (See Comments)    GOUT   Lisinopril Cough        Objective:    BP (!) 141/90 (BP Location: Left Arm, Patient Position: Sitting, Cuff Size: Large)   Pulse 90   Resp 19   Ht 5\' 7"  (1.702 m)   Wt 240 lb (108.9 kg)   SpO2 100%   BMI 37.59 kg/m  Wt Readings from Last 3 Encounters:  01/21/24 240 lb (108.9 kg)  10/22/23 242 lb 9.6 oz (110 kg)  07/11/23 244 lb 9.6 oz (110.9 kg)    Physical Exam Vitals and nursing note reviewed.  Constitutional:      Appearance: She is well-developed.  HENT:     Head: Normocephalic and atraumatic.  Cardiovascular:     Rate and Rhythm: Normal rate and regular rhythm.     Heart sounds: Normal heart sounds. No murmur heard.    No friction rub. No gallop.  Pulmonary:     Effort: Pulmonary effort is normal. No tachypnea or respiratory distress.     Breath sounds: Normal breath sounds. No decreased breath sounds, wheezing, rhonchi or rales.  Chest:     Chest wall: No tenderness.  Abdominal:     General: Bowel sounds are normal.     Palpations: Abdomen is soft.  Musculoskeletal:        General: Normal range of motion.     Cervical back: Normal range of motion.  Skin:    General: Skin is warm and dry.  Neurological:     Mental Status: She is alert and oriented to person, place, and time.     Coordination: Coordination normal.  Psychiatric:        Behavior: Behavior normal. Behavior is cooperative.        Thought Content: Thought content normal.        Judgment: Judgment normal.          Patient has been counseled extensively about nutrition and exercise as well as the importance of adherence with medications and regular follow-up. The patient was given clear instructions to go to ER or return to medical center if symptoms don't improve, worsen or new problems develop. The patient verbalized understanding.   Follow-up: Return in about 3 months (around 04/21/2024).   Claiborne Rigg, FNP-BC [facility] [place], [place] [phone]   01/21/2024, 5:33 PM

## 2024-01-24 ENCOUNTER — Ambulatory Visit
Admission: RE | Admit: 2024-01-24 | Discharge: 2024-01-24 | Disposition: A | Discharge status: Home / Self Care | Source: Ambulatory Visit | Attending: Nurse Practitioner | Admitting: Nurse Practitioner

## 2024-01-24 ENCOUNTER — Ambulatory Visit

## 2024-01-24 DIAGNOSIS — Z1231 Encounter for screening mammogram for malignant neoplasm of breast: Secondary | ICD-10-CM

## 2024-01-29 ENCOUNTER — Encounter: Payer: Self-pay | Admitting: Nurse Practitioner

## 2024-04-21 ENCOUNTER — Ambulatory Visit: Admitting: Nurse Practitioner

## 2024-05-07 ENCOUNTER — Ambulatory Visit: Admitting: Surgical

## 2024-05-08 ENCOUNTER — Ambulatory Visit: Admitting: Orthopedic Surgery

## 2024-05-14 ENCOUNTER — Ambulatory Visit: Admitting: Orthopedic Surgery

## 2024-05-14 ENCOUNTER — Encounter: Payer: Self-pay | Admitting: Orthopedic Surgery

## 2024-05-14 DIAGNOSIS — M1711 Unilateral primary osteoarthritis, right knee: Secondary | ICD-10-CM

## 2024-05-14 MED ORDER — BUPIVACAINE HCL 0.25 % IJ SOLN
4.0000 mL | INTRAMUSCULAR | Status: AC | PRN
  Administered 2024-05-14: 4 mL via INTRA_ARTICULAR

## 2024-05-14 MED ORDER — LIDOCAINE HCL 1 % IJ SOLN
5.0000 mL | INTRAMUSCULAR | Status: AC | PRN
  Administered 2024-05-14: 5 mL

## 2024-05-14 MED ORDER — TRIAMCINOLONE ACETONIDE 40 MG/ML IJ SUSP
40.0000 mg | INTRAMUSCULAR | Status: AC | PRN
  Administered 2024-05-14: 40 mg via INTRA_ARTICULAR

## 2024-05-14 NOTE — Progress Notes (Signed)
 Office Visit Note   Patient: Tammy Barajas           Date of Birth: 18-Jan-1966           MRN: [id number] Visit Date: 05/14/2024 Requested by: Theotis Haze ORN, NP [address] [place] [address] [place],  [place] [address] PCP: Theotis Haze ORN, NP  Subjective: Chief Complaint  Patient presents with   Right Knee - Pain    HPI: Tammy Barajas is a 58 y.o. female who presents to the office reporting right knee pain.  Had injection in the right knee 4 months ago which gave her good relief.  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  Reports continued pain and swelling in the knee and would like a repeat injection.  Is taking some over-the-counter medication..                ROS: All systems reviewed are negative as they relate to the chief complaint within the history of present illness.  Patient denies fevers or chills.  Assessment & Plan: Visit Diagnoses:  1. Unilateral primary osteoarthritis, right knee     Plan: Impression is right knee pain and arthritis.  Plan is injection today.  Could consider repeat injection in 4 to 6 months.  Continue with nonload bearing quad strengthening exercises.  Follow-up as needed.  Follow-Up Instructions: No follow-ups on file.   Orders:  No orders of the defined types were placed in this encounter.  No orders of the defined types were placed in this encounter.     Procedures: Large Joint Inj: R knee on 05/14/2024 9:30 PM Indications: diagnostic evaluation, joint swelling and pain Details: 18 G 1.5 in needle, superolateral approach  Arthrogram: No  Medications: 5 mL lidocaine  1 %; 4 mL bupivacaine  0.25 %; 40 mg triamcinolone  acetonide 40 MG/ML Outcome: tolerated well, no immediate complications Procedure, treatment alternatives, risks and benefits explained, specific risks discussed. Consent was given by the patient. Immediately prior to procedure a time out was called to verify the correct patient, procedure, equipment, support staff  and site/side marked as required. Patient was prepped and draped in the usual sterile fashion.       Clinical Data: No additional findings.  Objective: Vital Signs: There were no vitals taken for this visit.  Physical Exam:  Constitutional: Patient appears well-developed HEENT:  Head: Normocephalic Eyes:EOM are normal Neck: Normal range of motion Cardiovascular: Normal rate Pulmonary/chest: Effort normal Neurologic: Patient is alert Skin: Skin is warm Psychiatric: Patient has normal mood and affect  Ortho Exam: Ortho exam demonstrates slightly antalgic gait to the right.  Trace effusion in the right knee.  Collateral cruciate ligaments are stable.  Lacks about 3 degrees of full extension.  Is able to flex past 90.  Collateral and cruciate ligaments are stable.  Specialty Comments:  No specialty comments available.  Imaging: No results found.   PMFS History: Patient Active Problem List   Diagnosis Date Noted   Atypical glandular cells of undetermined significance (AGUS) on cervical Pap smear 12/18/2022   Cervical stenosis (uterine cervix) 11/27/2022   Abnormal uterine bleeding 01/31/2022   Hyperlipidemia 01/31/2022   Alopecia 01/31/2022   Class 2 obesity due to excess calories without serious comorbidity with body mass index (BMI) of 38.0 to 38.9 in adult 01/31/2022   Essential hypertension 07/20/2016   Past Medical History:  Diagnosis Date   Gout    Hyperlipidemia    Hypertension     Family History  Problem Relation Age of Onset   Hypertension Mother    Healthy Father    Breast cancer Maternal Aunt    Hypertension Brother     Past Surgical History:  Procedure Laterality Date   ENDOMETRIAL BIOPSY  11/28/2022   Social History   Occupational History   Not on file  Tobacco Use   Smoking status: Never   Smokeless tobacco: Never  Vaping Use   Vaping status: Never Used  Substance and Sexual Activity   Alcohol use: Yes    Comment: occassionally   Drug  use: Yes    Frequency: 3.0 times per week    Types: Marijuana    Comment: last use 05/09/15   Sexual activity: Yes    Birth control/protection: None, Condom

## 2024-07-09 ENCOUNTER — Other Ambulatory Visit: Payer: Self-pay | Admitting: Family Medicine

## 2024-07-09 ENCOUNTER — Telehealth: Payer: Self-pay

## 2024-07-09 NOTE — Telephone Encounter (Addendum)
-----   Message from Nurse Margy ORN sent at 07/09/2024  2:14 PM EDT ----- Regarding: Pt call Pt VM received with concerns that she's still bleeding. Would like a return call.  Contacted pt and pt stated that she is having VB.  I confirmed with pt if she is taking her Megace .  Pt reports that she has not been taking it as she ran out.  I advised pt that she has 5 refills that she will just need to contact her [facility] pharmacy.  Pt verbalized understanding.   Railynn Ballo,RN  07/09/24

## 2024-07-14 ENCOUNTER — Telehealth: Payer: Self-pay | Admitting: Nurse Practitioner

## 2024-07-14 ENCOUNTER — Other Ambulatory Visit: Payer: Self-pay | Admitting: Nurse Practitioner

## 2024-07-14 DIAGNOSIS — I10 Essential (primary) hypertension: Secondary | ICD-10-CM

## 2024-07-14 MED ORDER — AMLODIPINE BESYLATE 10 MG PO TABS: 10.0000 mg | ORAL_TABLET | Freq: Every day | ORAL | 1 refills | Status: AC

## 2024-07-14 NOTE — Telephone Encounter (Signed)
 Amlodipine  10 sent to [facility]. Based on her BP I can not send in 5 mg. If she wants to take 5mg   she can break it in half but I would advise her to take the full tablet.

## 2024-07-14 NOTE — Telephone Encounter (Signed)
 Copied from CRM [id number]. Topic: Clinical - Medication Question >> Jul 14, 2024  2:55 PM Yolanda T wrote:  Reason for CRM: patient called stated she went to the dentist but was not able clean her teeth because her blood pressure was too high. Patient is asking to be placed on Amlodipine  5mg . I see the 10mg  on her med list. Please f/u with patient

## 2024-07-15 NOTE — Telephone Encounter (Signed)
 Patient identified by name and date of birth.  Patient aware of results and voiced understanding.  Patient states that she will break the tablet in half.

## 2024-08-25 ENCOUNTER — Encounter: Payer: Self-pay | Admitting: Radiology

## 2024-09-15 ENCOUNTER — Ambulatory Visit: Admitting: Orthopedic Surgery

## 2024-09-15 DIAGNOSIS — M1711 Unilateral primary osteoarthritis, right knee: Secondary | ICD-10-CM | POA: Diagnosis not present

## 2024-09-16 ENCOUNTER — Encounter: Payer: Self-pay | Admitting: Orthopedic Surgery

## 2024-09-16 MED ORDER — BUPIVACAINE HCL 0.25 % IJ SOLN
4.0000 mL | INTRAMUSCULAR | Status: AC | PRN
  Administered 2024-09-15: 4 mL via INTRA_ARTICULAR

## 2024-09-16 MED ORDER — LIDOCAINE HCL 1 % IJ SOLN
5.0000 mL | INTRAMUSCULAR | Status: AC | PRN
  Administered 2024-09-15: 5 mL

## 2024-09-16 MED ORDER — TRIAMCINOLONE ACETONIDE 40 MG/ML IJ SUSP
40.0000 mg | INTRAMUSCULAR | Status: AC | PRN
  Administered 2024-09-15: 40 mg via INTRA_ARTICULAR

## 2024-09-16 NOTE — Progress Notes (Signed)
 Office Visit Note   Patient: Tammy Barajas           Date of Birth: Aug 29, 1966           MRN: [id number] Visit Date: 09/15/2024 Requested by: Theotis Haze ORN, NP [address] [place] [address] [place],  [place] [address] PCP: Theotis Haze ORN, NP  Subjective: Chief Complaint  Patient presents with   Right Knee - Pain    HPI: Tammy Barajas is a 58 y.o. female who presents to the office reporting right knee pain.  Prior cortisone injection 05/14/2024 helped.  Patient does have a brace that she wears.  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  Does not really take any regular medication for the problem.  Denies much in the way of mechanical symptoms..                ROS: All systems reviewed are negative as they relate to the chief complaint within the history of present illness.  Patient denies fevers or chills.  Assessment & Plan: Visit Diagnoses: No diagnosis found.  Plan: Impression is right knee arthritis.  Mild effusion is present.  Patient will modify her workout routine.  Aspiration and injection performed today.  This has helped her in the past.  She will come back as needed for further intervention.  She is walking for exercise but not too much and she is doing some quad strengthening along the way as well.  Follow-Up Instructions: No follow-ups on file.   Orders:  No orders of the defined types were placed in this encounter.  No orders of the defined types were placed in this encounter.     Procedures: Large Joint Inj: R knee on 09/15/2024 10:48 AM Indications: diagnostic evaluation, joint swelling and pain Details: 18 G 1.5 in needle, superolateral approach  Arthrogram: No  Medications: 5 mL lidocaine  1 %; 4 mL bupivacaine  0.25 %; 40 mg triamcinolone  acetonide 40 MG/ML Aspirate: 30 mL yellow Outcome: tolerated well, no immediate complications Procedure, treatment alternatives, risks and benefits explained, specific risks discussed. Consent was given by  the patient. Immediately prior to procedure a time out was called to verify the correct patient, procedure, equipment, support staff and site/side marked as required. Patient was prepped and draped in the usual sterile fashion.       Clinical Data: No additional findings.  Objective: Vital Signs: There were no vitals taken for this visit.  Physical Exam:  Constitutional: Patient appears well-developed HEENT:  Head: Normocephalic Eyes:EOM are normal Neck: Normal range of motion Cardiovascular: Normal rate Pulmonary/chest: Effort normal Neurologic: Patient is alert Skin: Skin is warm Psychiatric: Patient has normal mood and affect  Ortho Exam: Ortho exam demonstrates about a 3 degree flexion contracture on the right.  Flexion to about 125.  Mild effusion present.  Pedal pulses palpable.  No groin pain with internal or external rotation of the leg.  No other masses lymphadenopathy or skin changes noted in the right knee region.  Collateral and cruciate ligaments are stable.  Specialty Comments:  No specialty comments available.  Imaging: No results found.   PMFS History: Patient Active Problem List   Diagnosis Date Noted   Atypical glandular cells of undetermined significance (AGUS) on cervical Pap smear 12/18/2022   Cervical stenosis (uterine cervix) 11/27/2022   Abnormal uterine bleeding 01/31/2022   Hyperlipidemia 01/31/2022   Alopecia 01/31/2022   Class 2 obesity due to excess calories without serious comorbidity with body mass  index (BMI) of 38.0 to 38.9 in adult 01/31/2022   Essential hypertension 07/20/2016   Past Medical History:  Diagnosis Date   Gout    Hyperlipidemia    Hypertension     Family History  Problem Relation Age of Onset   Hypertension Mother    Healthy Father    Breast cancer Maternal Aunt    Hypertension Brother     Past Surgical History:  Procedure Laterality Date   ENDOMETRIAL BIOPSY  11/28/2022   Social History   Occupational  History   Not on file  Tobacco Use   Smoking status: Never   Smokeless tobacco: Never  Vaping Use   Vaping status: Never Used  Substance and Sexual Activity   Alcohol use: Yes    Comment: occassionally   Drug use: Yes    Frequency: 3.0 times per week    Types: Marijuana    Comment: last use 05/09/15   Sexual activity: Yes    Birth control/protection: None, Condom

## 2025-01-14 ENCOUNTER — Ambulatory Visit: Admitting: Orthopedic Surgery
